# Patient Record
Sex: Male | Born: 1965 | Race: Black or African American | Hispanic: No | Marital: Single | Smoking: Current every day smoker
Health system: Southern US, Community
[De-identification: ages and names within clinical notes are randomized; demographics above are authoritative.]

## PROBLEM LIST (undated history)

## (undated) DIAGNOSIS — I10 Essential (primary) hypertension: Secondary | ICD-10-CM

## (undated) HISTORY — PX: ABDOMINAL SURGERY: SHX537

## (undated) HISTORY — PX: CHOLECYSTECTOMY: SHX55

---

## 2006-11-21 ENCOUNTER — Emergency Department: Payer: Self-pay | Admitting: Unknown Physician Specialty

## 2008-12-31 ENCOUNTER — Emergency Department: Payer: Self-pay | Admitting: Emergency Medicine

## 2010-09-10 ENCOUNTER — Emergency Department: Payer: Self-pay | Admitting: Emergency Medicine

## 2013-08-17 LAB — COMPREHENSIVE METABOLIC PANEL
Alkaline Phosphatase: 112 U/L (ref 50–136)
Anion Gap: 7 (ref 7–16)
Calcium, Total: 9.7 mg/dL (ref 8.5–10.1)
Co2: 29 mmol/L (ref 21–32)
EGFR (Non-African Amer.): >60
Osmolality: 275 (ref 275–301)
Sodium: 135 mmol/L — ABNORMAL LOW (ref 136–145)
Total Protein: 9.2 g/dL — ABNORMAL HIGH (ref 6.4–8.2)

## 2013-08-17 LAB — CBC
HGB: 17.9 g/dL (ref 13.0–18.0)
MCH: 31.6 pg (ref 26.0–34.0)
MCHC: 35.2 g/dL (ref 32.0–36.0)

## 2013-08-18 ENCOUNTER — Inpatient Hospital Stay: Payer: Self-pay | Admitting: Internal Medicine

## 2013-08-18 LAB — CBC WITH DIFFERENTIAL/PLATELET
Eosinophil #: 0 10*3/uL (ref 0.0–0.7)
Eosinophil %: 0.1 %
HGB: 16.6 g/dL (ref 13.0–18.0)
Lymphocyte #: 1.9 10*3/uL (ref 1.0–3.6)
MCHC: 35.6 g/dL (ref 32.0–36.0)
MCV: 89 fL (ref 80–100)
Monocyte #: 1.8 x10 3/mm — ABNORMAL HIGH (ref 0.2–1.0)
Monocyte %: 12.6 %
Neutrophil %: 73.5 %
RBC: 5.24 10*6/uL (ref 4.40–5.90)
RDW: 12.6 % (ref 11.5–14.5)

## 2013-08-18 LAB — URINALYSIS, COMPLETE
Bacteria: NONE SEEN
Glucose,UR: NEGATIVE mg/dL (ref 0–75)
Leukocyte Esterase: NEGATIVE
Nitrite: NEGATIVE
Ph: 6 (ref 4.5–8.0)
RBC,UR: 46 /HPF (ref 0–5)
Specific Gravity: 1.027 (ref 1.003–1.030)
WBC UR: 4 /HPF (ref 0–5)

## 2013-08-18 LAB — CK-MB: CK-MB: 0.9 ng/mL (ref 0.5–3.6)

## 2013-08-18 LAB — TROPONIN I
Troponin-I: 0.06 ng/mL — ABNORMAL HIGH
Troponin-I: 0.05 ng/mL

## 2013-08-18 LAB — APTT: Activated PTT: 28.5 secs (ref 23.6–35.9)

## 2013-08-18 LAB — MAGNESIUM: Magnesium: 1.4 mg/dL — ABNORMAL LOW

## 2013-08-18 LAB — LIPID PANEL
Cholesterol: 131 mg/dL (ref 0–200)
HDL Cholesterol: 32 mg/dL — ABNORMAL LOW (ref 40–60)
Ldl Cholesterol, Calc: 84 mg/dL (ref 0–100)

## 2013-08-19 LAB — CBC WITH DIFFERENTIAL/PLATELET
Basophil %: 0.4 %
Eosinophil #: 0 10*3/uL (ref 0.0–0.7)
HCT: 47.2 % (ref 40.0–52.0)
HGB: 16.5 g/dL (ref 13.0–18.0)
Lymphocyte #: 2.4 10*3/uL (ref 1.0–3.6)
MCH: 31.6 pg (ref 26.0–34.0)
MCHC: 34.9 g/dL (ref 32.0–36.0)
MCV: 91 fL (ref 80–100)
Monocyte #: 1.7 x10 3/mm — ABNORMAL HIGH (ref 0.2–1.0)
Monocyte %: 12.8 %
Neutrophil #: 9.1 10*3/uL — ABNORMAL HIGH (ref 1.4–6.5)
Platelet: 251 10*3/uL (ref 150–440)
RBC: 5.2 10*6/uL (ref 4.40–5.90)
RDW: 12.4 % (ref 11.5–14.5)
WBC: 13.2 10*3/uL — ABNORMAL HIGH (ref 3.8–10.6)

## 2013-08-19 LAB — BASIC METABOLIC PANEL
Chloride: 104 mmol/L (ref 98–107)
Creatinine: 1.09 mg/dL (ref 0.60–1.30)
Osmolality: 274 (ref 275–301)
Potassium: 3.8 mmol/L (ref 3.5–5.1)
Sodium: 136 mmol/L (ref 136–145)

## 2013-08-19 LAB — APTT
Activated PTT: 66.6 secs — ABNORMAL HIGH (ref 23.6–35.9)
Activated PTT: 49.2 secs — ABNORMAL HIGH (ref 23.6–35.9)

## 2013-08-19 LAB — CK-MB: CK-MB: 1 ng/mL (ref 0.5–3.6)

## 2013-08-23 LAB — CULTURE, BLOOD (SINGLE)

## 2013-09-11 ENCOUNTER — Inpatient Hospital Stay: Payer: Self-pay | Admitting: Internal Medicine

## 2013-09-11 LAB — COMPREHENSIVE METABOLIC PANEL
Alkaline Phosphatase: 85 U/L
Calcium, Total: 9.7 mg/dL (ref 8.5–10.1)
Chloride: 104 mmol/L (ref 98–107)
Co2: 27 mmol/L (ref 21–32)
EGFR (African American): >60
Glucose: 118 mg/dL — ABNORMAL HIGH (ref 65–99)
Osmolality: 278 (ref 275–301)
Potassium: 3.5 mmol/L (ref 3.5–5.1)
SGOT(AST): 44 U/L — ABNORMAL HIGH (ref 15–37)
SGPT (ALT): 57 U/L (ref 12–78)
Sodium: 137 mmol/L (ref 136–145)
Total Protein: 8.9 g/dL — ABNORMAL HIGH (ref 6.4–8.2)

## 2013-09-11 LAB — URINALYSIS, COMPLETE
Bacteria: NONE SEEN
Bilirubin,UR: NEGATIVE
Glucose,UR: NEGATIVE mg/dL (ref 0–75)
Leukocyte Esterase: NEGATIVE
Nitrite: NEGATIVE
Ph: 5 (ref 4.5–8.0)
Protein: 30
RBC,UR: 6 /HPF (ref 0–5)
Specific Gravity: 1.025 (ref 1.003–1.030)
Squamous Epithelial: NONE SEEN
WBC UR: 3 /HPF (ref 0–5)

## 2013-09-11 LAB — CBC
HCT: 48.5 % (ref 40.0–52.0)
MCH: 31.6 pg (ref 26.0–34.0)
MCHC: 35 g/dL (ref 32.0–36.0)
MCV: 90 fL (ref 80–100)
RDW: 12.4 % (ref 11.5–14.5)
WBC: 10.9 10*3/uL — ABNORMAL HIGH (ref 3.8–10.6)

## 2013-09-11 LAB — LIPASE, BLOOD: Lipase: 1018 U/L — ABNORMAL HIGH (ref 73–393)

## 2013-09-11 LAB — TROPONIN I: Troponin-I: <0.02 ng/mL

## 2013-09-12 LAB — LIPID PANEL
Cholesterol: 116 mg/dL (ref 0–200)
HDL Cholesterol: 26 mg/dL — ABNORMAL LOW (ref 40–60)
Ldl Cholesterol, Calc: 73 mg/dL (ref 0–100)
Triglycerides: 87 mg/dL (ref 0–200)

## 2013-09-12 LAB — CBC WITH DIFFERENTIAL/PLATELET
Eosinophil #: 0.2 10*3/uL (ref 0.0–0.7)
Eosinophil %: 2.6 %
HCT: 41.1 % (ref 40.0–52.0)
HGB: 14.3 g/dL (ref 13.0–18.0)
Lymphocyte %: 30.7 %
MCH: 31.8 pg (ref 26.0–34.0)
MCHC: 34.8 g/dL (ref 32.0–36.0)
MCV: 91 fL (ref 80–100)
Monocyte #: 1.2 x10 3/mm — ABNORMAL HIGH (ref 0.2–1.0)
Neutrophil #: 4.1 10*3/uL (ref 1.4–6.5)
Neutrophil %: 51.5 %
RBC: 4.5 10*6/uL (ref 4.40–5.90)
RDW: 12.3 % (ref 11.5–14.5)

## 2013-09-12 LAB — BASIC METABOLIC PANEL
BUN: 18 mg/dL (ref 7–18)
Calcium, Total: 9 mg/dL (ref 8.5–10.1)
Creatinine: 1.11 mg/dL (ref 0.60–1.30)
Potassium: 4.1 mmol/L (ref 3.5–5.1)

## 2013-09-14 LAB — COMPREHENSIVE METABOLIC PANEL
Albumin: 3.6 g/dL (ref 3.4–5.0)
Calcium, Total: 9.2 mg/dL (ref 8.5–10.1)
Co2: 26 mmol/L (ref 21–32)
Glucose: 88 mg/dL (ref 65–99)
Osmolality: 272 (ref 275–301)
SGPT (ALT): 74 U/L (ref 12–78)
Sodium: 137 mmol/L (ref 136–145)
Total Protein: 7.8 g/dL (ref 6.4–8.2)

## 2013-09-14 LAB — CBC WITH DIFFERENTIAL/PLATELET
Basophil %: 1 %
HCT: 46.3 % (ref 40.0–52.0)
HGB: 16.3 g/dL (ref 13.0–18.0)
MCHC: 35.2 g/dL (ref 32.0–36.0)
MCV: 90 fL (ref 80–100)
Monocyte #: 1.3 x10 3/mm — ABNORMAL HIGH (ref 0.2–1.0)
Neutrophil #: 4.1 10*3/uL (ref 1.4–6.5)
RBC: 5.16 10*6/uL (ref 4.40–5.90)

## 2013-09-14 LAB — LIPASE, BLOOD: Lipase: 196 U/L (ref 73–393)

## 2013-11-02 ENCOUNTER — Ambulatory Visit: Payer: Self-pay | Admitting: Gastroenterology

## 2013-11-04 ENCOUNTER — Inpatient Hospital Stay: Payer: Self-pay | Admitting: Internal Medicine

## 2013-11-04 LAB — URINALYSIS, COMPLETE
BACTERIA: NONE SEEN
BILIRUBIN, UR: NEGATIVE
BLOOD: NEGATIVE
Glucose,UR: NEGATIVE mg/dL (ref 0–75)
KETONE: NEGATIVE
LEUKOCYTE ESTERASE: NEGATIVE
NITRITE: NEGATIVE
Ph: 8 (ref 4.5–8.0)
Protein: 30
RBC,UR: 7 /HPF (ref 0–5)
SQUAMOUS EPITHELIAL: NONE SEEN
Specific Gravity: 1.021 (ref 1.003–1.030)
WBC UR: <1 /HPF (ref 0–5)

## 2013-11-04 LAB — CBC
HCT: 50.6 % (ref 40.0–52.0)
HGB: 17.7 g/dL (ref 13.0–18.0)
MCH: 31.8 pg (ref 26.0–34.0)
MCHC: 34.9 g/dL (ref 32.0–36.0)
MCV: 91 fL (ref 80–100)
PLATELETS: 323 10*3/uL (ref 150–440)
RBC: 5.55 10*6/uL (ref 4.40–5.90)
RDW: 12.9 % (ref 11.5–14.5)
WBC: 9.2 10*3/uL (ref 3.8–10.6)

## 2013-11-04 LAB — COMPREHENSIVE METABOLIC PANEL
ALBUMIN: 4.5 g/dL (ref 3.4–5.0)
Alkaline Phosphatase: 87 U/L
Anion Gap: 2 — ABNORMAL LOW (ref 7–16)
BUN: 15 mg/dL (ref 7–18)
Bilirubin,Total: 1.4 mg/dL — ABNORMAL HIGH (ref 0.2–1.0)
CALCIUM: 9.8 mg/dL (ref 8.5–10.1)
Chloride: 100 mmol/L (ref 98–107)
Co2: 33 mmol/L — ABNORMAL HIGH (ref 21–32)
Creatinine: 1.14 mg/dL (ref 0.60–1.30)
EGFR (African American): >60
GLUCOSE: 120 mg/dL — AB (ref 65–99)
OSMOLALITY: 272 (ref 275–301)
Potassium: 3.6 mmol/L (ref 3.5–5.1)
SGOT(AST): 38 U/L — ABNORMAL HIGH (ref 15–37)
SGPT (ALT): 57 U/L (ref 12–78)
Sodium: 135 mmol/L — ABNORMAL LOW (ref 136–145)
Total Protein: 9.3 g/dL — ABNORMAL HIGH (ref 6.4–8.2)

## 2013-11-04 LAB — AMYLASE: Amylase: 153 U/L — ABNORMAL HIGH (ref 25–115)

## 2013-11-04 LAB — LIPASE, BLOOD: LIPASE: 678 U/L — AB (ref 73–393)

## 2013-11-06 LAB — COMPREHENSIVE METABOLIC PANEL
AST: 36 U/L (ref 15–37)
Albumin: 3.3 g/dL — ABNORMAL LOW (ref 3.4–5.0)
Alkaline Phosphatase: 62 U/L
Anion Gap: 7 (ref 7–16)
BUN: 11 mg/dL (ref 7–18)
Bilirubin,Total: 1.6 mg/dL — ABNORMAL HIGH (ref 0.2–1.0)
Calcium, Total: 9 mg/dL (ref 8.5–10.1)
Chloride: 102 mmol/L (ref 98–107)
Co2: 24 mmol/L (ref 21–32)
Creatinine: 1 mg/dL (ref 0.60–1.30)
EGFR (African American): >60
EGFR (Non-African Amer.): >60
Glucose: 87 mg/dL (ref 65–99)
Osmolality: 265 (ref 275–301)
Potassium: 3.4 mmol/L — ABNORMAL LOW (ref 3.5–5.1)
SGPT (ALT): 37 U/L (ref 12–78)
Sodium: 133 mmol/L — ABNORMAL LOW (ref 136–145)
Total Protein: 6.8 g/dL (ref 6.4–8.2)

## 2013-11-06 LAB — CBC WITH DIFFERENTIAL/PLATELET
Basophil #: 0 10*3/uL (ref 0.0–0.1)
Basophil %: 0.5 %
EOS PCT: 5.6 %
Eosinophil #: 0.4 10*3/uL (ref 0.0–0.7)
HCT: 41.8 % (ref 40.0–52.0)
HGB: 14.4 g/dL (ref 13.0–18.0)
LYMPHS ABS: 1.8 10*3/uL (ref 1.0–3.6)
LYMPHS PCT: 24 %
MCH: 32.1 pg (ref 26.0–34.0)
MCHC: 34.5 g/dL (ref 32.0–36.0)
MCV: 93 fL (ref 80–100)
MONO ABS: 1.1 x10 3/mm — AB (ref 0.2–1.0)
Monocyte %: 14.3 %
NEUTROS ABS: 4.1 10*3/uL (ref 1.4–6.5)
Neutrophil %: 55.6 %
Platelet: 257 10*3/uL (ref 150–440)
RBC: 4.5 10*6/uL (ref 4.40–5.90)
RDW: 12.6 % (ref 11.5–14.5)
WBC: 7.4 10*3/uL (ref 3.8–10.6)

## 2013-11-06 LAB — LIPASE, BLOOD: Lipase: 842 U/L — ABNORMAL HIGH (ref 73–393)

## 2013-11-07 LAB — LIPASE, BLOOD: Lipase: 3026 U/L — ABNORMAL HIGH (ref 73–393)

## 2013-11-08 LAB — LIPASE, BLOOD: Lipase: 241 U/L (ref 73–393)

## 2013-12-07 LAB — COMPREHENSIVE METABOLIC PANEL
ALT: 51 U/L (ref 12–78)
Albumin: 4.2 g/dL (ref 3.4–5.0)
Alkaline Phosphatase: 78 U/L
Anion Gap: 7 (ref 7–16)
BILIRUBIN TOTAL: 1.8 mg/dL — AB (ref 0.2–1.0)
BUN: 20 mg/dL — ABNORMAL HIGH (ref 7–18)
Calcium, Total: 9.7 mg/dL (ref 8.5–10.1)
Chloride: 97 mmol/L — ABNORMAL LOW (ref 98–107)
Co2: 31 mmol/L (ref 21–32)
Creatinine: 1.11 mg/dL (ref 0.60–1.30)
EGFR (African American): >60
Glucose: 138 mg/dL — ABNORMAL HIGH (ref 65–99)
Osmolality: 275 (ref 275–301)
Potassium: 3.2 mmol/L — ABNORMAL LOW (ref 3.5–5.1)
SGOT(AST): 51 U/L — ABNORMAL HIGH (ref 15–37)
SODIUM: 135 mmol/L — AB (ref 136–145)
Total Protein: 8.8 g/dL — ABNORMAL HIGH (ref 6.4–8.2)

## 2013-12-07 LAB — CBC WITH DIFFERENTIAL/PLATELET
Basophil #: 0.1 10*3/uL (ref 0.0–0.1)
Basophil %: 0.8 %
EOS PCT: 0.8 %
Eosinophil #: 0.1 10*3/uL (ref 0.0–0.7)
HCT: 50.3 % (ref 40.0–52.0)
HGB: 17.7 g/dL (ref 13.0–18.0)
Lymphocyte #: 2.7 10*3/uL (ref 1.0–3.6)
Lymphocyte %: 28 %
MCH: 32.3 pg (ref 26.0–34.0)
MCHC: 35.2 g/dL (ref 32.0–36.0)
MCV: 92 fL (ref 80–100)
MONOS PCT: 12.2 %
Monocyte #: 1.2 x10 3/mm — ABNORMAL HIGH (ref 0.2–1.0)
NEUTROS PCT: 58.2 %
Neutrophil #: 5.7 10*3/uL (ref 1.4–6.5)
PLATELETS: 346 10*3/uL (ref 150–440)
RBC: 5.49 10*6/uL (ref 4.40–5.90)
RDW: 12.9 % (ref 11.5–14.5)
WBC: 9.8 10*3/uL (ref 3.8–10.6)

## 2013-12-07 LAB — LIPASE, BLOOD: LIPASE: 422 U/L — AB (ref 73–393)

## 2013-12-08 ENCOUNTER — Inpatient Hospital Stay: Payer: Self-pay | Admitting: Surgery

## 2013-12-08 LAB — URINALYSIS, COMPLETE
Bacteria: NONE SEEN
Bilirubin,UR: NEGATIVE
GLUCOSE, UR: NEGATIVE mg/dL (ref 0–75)
Leukocyte Esterase: NEGATIVE
NITRITE: NEGATIVE
Ph: 5 (ref 4.5–8.0)
Protein: 100
RBC,UR: 11 /HPF (ref 0–5)
SPECIFIC GRAVITY: 1.028 (ref 1.003–1.030)

## 2013-12-08 LAB — AMYLASE: AMYLASE: 110 U/L (ref 25–115)

## 2013-12-08 LAB — PROTIME-INR
INR: 1.2
Prothrombin Time: 14.8 secs — ABNORMAL HIGH (ref 11.5–14.7)

## 2013-12-08 LAB — APTT: Activated PTT: 28.8 secs (ref 23.6–35.9)

## 2013-12-09 LAB — CBC WITH DIFFERENTIAL/PLATELET
BASOS PCT: 0.7 %
Basophil #: 0 10*3/uL (ref 0.0–0.1)
Eosinophil #: 0.3 10*3/uL (ref 0.0–0.7)
Eosinophil %: 4.5 %
HCT: 42.2 % (ref 40.0–52.0)
HGB: 14.5 g/dL (ref 13.0–18.0)
LYMPHS PCT: 40.1 %
Lymphocyte #: 2.8 10*3/uL (ref 1.0–3.6)
MCH: 31.7 pg (ref 26.0–34.0)
MCHC: 34.3 g/dL (ref 32.0–36.0)
MCV: 92 fL (ref 80–100)
MONOS PCT: 10.5 %
Monocyte #: 0.7 x10 3/mm (ref 0.2–1.0)
Neutrophil #: 3.1 10*3/uL (ref 1.4–6.5)
Neutrophil %: 44.2 %
Platelet: 263 10*3/uL (ref 150–440)
RBC: 4.57 10*6/uL (ref 4.40–5.90)
RDW: 13 % (ref 11.5–14.5)
WBC: 7 10*3/uL (ref 3.8–10.6)

## 2013-12-09 LAB — COMPREHENSIVE METABOLIC PANEL
ALK PHOS: 56 U/L
ANION GAP: 2 — AB (ref 7–16)
Albumin: 3.2 g/dL — ABNORMAL LOW (ref 3.4–5.0)
BUN: 14 mg/dL (ref 7–18)
Bilirubin,Total: 0.9 mg/dL (ref 0.2–1.0)
CHLORIDE: 105 mmol/L (ref 98–107)
CREATININE: 1.14 mg/dL (ref 0.60–1.30)
Calcium, Total: 8.5 mg/dL (ref 8.5–10.1)
Co2: 32 mmol/L (ref 21–32)
EGFR (African American): >60
EGFR (Non-African Amer.): >60
Glucose: 108 mg/dL — ABNORMAL HIGH (ref 65–99)
Osmolality: 279 (ref 275–301)
POTASSIUM: 3.8 mmol/L (ref 3.5–5.1)
SGOT(AST): 28 U/L (ref 15–37)
SGPT (ALT): 32 U/L (ref 12–78)
SODIUM: 139 mmol/L (ref 136–145)
TOTAL PROTEIN: 6.5 g/dL (ref 6.4–8.2)

## 2013-12-09 LAB — MAGNESIUM: MAGNESIUM: 1.9 mg/dL

## 2013-12-09 LAB — AMYLASE: Amylase: 70 U/L (ref 25–115)

## 2013-12-09 LAB — LIPASE, BLOOD: Lipase: 146 U/L (ref 73–393)

## 2013-12-10 LAB — BASIC METABOLIC PANEL
Anion Gap: 4 — ABNORMAL LOW (ref 7–16)
BUN: 10 mg/dL (ref 7–18)
CREATININE: 1.04 mg/dL (ref 0.60–1.30)
Calcium, Total: 8.4 mg/dL — ABNORMAL LOW (ref 8.5–10.1)
Chloride: 108 mmol/L — ABNORMAL HIGH (ref 98–107)
Co2: 28 mmol/L (ref 21–32)
EGFR (Non-African Amer.): >60
Glucose: 90 mg/dL (ref 65–99)
Osmolality: 278 (ref 275–301)
POTASSIUM: 3.4 mmol/L — AB (ref 3.5–5.1)
SODIUM: 140 mmol/L (ref 136–145)

## 2013-12-10 LAB — CBC WITH DIFFERENTIAL/PLATELET
BASOS PCT: 0.8 %
Basophil #: 0.1 10*3/uL (ref 0.0–0.1)
Eosinophil #: 0.4 10*3/uL (ref 0.0–0.7)
Eosinophil %: 7.2 %
HCT: 40.5 % (ref 40.0–52.0)
HGB: 14.1 g/dL (ref 13.0–18.0)
LYMPHS ABS: 2.8 10*3/uL (ref 1.0–3.6)
LYMPHS PCT: 45.7 %
MCH: 32.1 pg (ref 26.0–34.0)
MCHC: 34.8 g/dL (ref 32.0–36.0)
MCV: 92 fL (ref 80–100)
MONO ABS: 0.7 x10 3/mm (ref 0.2–1.0)
Monocyte %: 11.9 %
Neutrophil #: 2.1 10*3/uL (ref 1.4–6.5)
Neutrophil %: 34.4 %
PLATELETS: 252 10*3/uL (ref 150–440)
RBC: 4.38 10*6/uL — ABNORMAL LOW (ref 4.40–5.90)
RDW: 12.9 % (ref 11.5–14.5)
WBC: 6.2 10*3/uL (ref 3.8–10.6)

## 2013-12-10 LAB — LIPASE, BLOOD: LIPASE: 165 U/L (ref 73–393)

## 2013-12-10 LAB — AMYLASE: AMYLASE: 64 U/L (ref 25–115)

## 2013-12-21 ENCOUNTER — Ambulatory Visit: Payer: Self-pay | Admitting: Surgery

## 2013-12-21 DIAGNOSIS — I10 Essential (primary) hypertension: Secondary | ICD-10-CM

## 2013-12-21 LAB — CBC WITH DIFFERENTIAL/PLATELET
Basophil #: 0.1 10*3/uL (ref 0.0–0.1)
Basophil %: 0.9 %
EOS ABS: 0.5 10*3/uL (ref 0.0–0.7)
EOS PCT: 8.2 %
HCT: 43.9 % (ref 40.0–52.0)
HGB: 15.3 g/dL (ref 13.0–18.0)
LYMPHS ABS: 1.9 10*3/uL (ref 1.0–3.6)
Lymphocyte %: 33.4 %
MCH: 32.4 pg (ref 26.0–34.0)
MCHC: 34.8 g/dL (ref 32.0–36.0)
MCV: 93 fL (ref 80–100)
Monocyte #: 0.8 x10 3/mm (ref 0.2–1.0)
Monocyte %: 13.3 %
NEUTROS PCT: 44.2 %
Neutrophil #: 2.5 10*3/uL (ref 1.4–6.5)
Platelet: 306 10*3/uL (ref 150–440)
RBC: 4.73 10*6/uL (ref 4.40–5.90)
RDW: 13.5 % (ref 11.5–14.5)
WBC: 5.7 10*3/uL (ref 3.8–10.6)

## 2013-12-21 LAB — BASIC METABOLIC PANEL
Anion Gap: 5 — ABNORMAL LOW (ref 7–16)
BUN: 13 mg/dL (ref 7–18)
CREATININE: 0.89 mg/dL (ref 0.60–1.30)
Calcium, Total: 8.6 mg/dL (ref 8.5–10.1)
Chloride: 108 mmol/L — ABNORMAL HIGH (ref 98–107)
Co2: 25 mmol/L (ref 21–32)
EGFR (African American): >60
GLUCOSE: 107 mg/dL — AB (ref 65–99)
OSMOLALITY: 276 (ref 275–301)
Potassium: 3.8 mmol/L (ref 3.5–5.1)
Sodium: 138 mmol/L (ref 136–145)

## 2013-12-21 LAB — LIPASE, BLOOD: Lipase: 325 U/L (ref 73–393)

## 2014-01-11 ENCOUNTER — Emergency Department: Payer: Self-pay | Admitting: Emergency Medicine

## 2014-01-11 LAB — COMPREHENSIVE METABOLIC PANEL
ANION GAP: 6 — AB (ref 7–16)
Albumin: 4.2 g/dL (ref 3.4–5.0)
Alkaline Phosphatase: 74 U/L
BILIRUBIN TOTAL: 0.9 mg/dL (ref 0.2–1.0)
BUN: 16 mg/dL (ref 7–18)
Calcium, Total: 10.1 mg/dL (ref 8.5–10.1)
Chloride: 101 mmol/L (ref 98–107)
Co2: 29 mmol/L (ref 21–32)
Creatinine: 1.15 mg/dL (ref 0.60–1.30)
EGFR (Non-African Amer.): >60
Glucose: 126 mg/dL — ABNORMAL HIGH (ref 65–99)
Osmolality: 275 (ref 275–301)
Potassium: 3.8 mmol/L (ref 3.5–5.1)
SGOT(AST): 25 U/L (ref 15–37)
SGPT (ALT): 39 U/L (ref 12–78)
Sodium: 136 mmol/L (ref 136–145)
TOTAL PROTEIN: 8.9 g/dL — AB (ref 6.4–8.2)

## 2014-01-11 LAB — CBC
HCT: 50.3 % (ref 40.0–52.0)
HGB: 17.3 g/dL (ref 13.0–18.0)
MCH: 31.7 pg (ref 26.0–34.0)
MCHC: 34.5 g/dL (ref 32.0–36.0)
MCV: 92 fL (ref 80–100)
Platelet: 297 10*3/uL (ref 150–440)
RBC: 5.47 10*6/uL (ref 4.40–5.90)
RDW: 13.1 % (ref 11.5–14.5)
WBC: 8 10*3/uL (ref 3.8–10.6)

## 2014-01-11 LAB — LIPASE, BLOOD: Lipase: 134 U/L (ref 73–393)

## 2014-01-12 LAB — URINALYSIS, COMPLETE
Bacteria: NONE SEEN
Bilirubin,UR: NEGATIVE
Glucose,UR: NEGATIVE mg/dL (ref 0–75)
Leukocyte Esterase: NEGATIVE
NITRITE: NEGATIVE
PH: 6 (ref 4.5–8.0)
PROTEIN: NEGATIVE
SPECIFIC GRAVITY: 1.048 (ref 1.003–1.030)
SQUAMOUS EPITHELIAL: NONE SEEN
WBC UR: <1 /HPF (ref 0–5)

## 2014-01-17 ENCOUNTER — Emergency Department: Payer: Self-pay

## 2014-01-17 LAB — COMPREHENSIVE METABOLIC PANEL
ALK PHOS: 77 U/L
AST: 29 U/L (ref 15–37)
Albumin: 4.2 g/dL (ref 3.4–5.0)
Anion Gap: 6 — ABNORMAL LOW (ref 7–16)
BILIRUBIN TOTAL: 1.5 mg/dL — AB (ref 0.2–1.0)
BUN: 17 mg/dL (ref 7–18)
CREATININE: 1.02 mg/dL (ref 0.60–1.30)
Calcium, Total: 9.5 mg/dL (ref 8.5–10.1)
Chloride: 100 mmol/L (ref 98–107)
Co2: 30 mmol/L (ref 21–32)
EGFR (Non-African Amer.): >60
Glucose: 117 mg/dL — ABNORMAL HIGH (ref 65–99)
OSMOLALITY: 275 (ref 275–301)
Potassium: 3.3 mmol/L — ABNORMAL LOW (ref 3.5–5.1)
SGPT (ALT): 37 U/L (ref 12–78)
SODIUM: 136 mmol/L (ref 136–145)
Total Protein: 8.7 g/dL — ABNORMAL HIGH (ref 6.4–8.2)

## 2014-01-17 LAB — CBC WITH DIFFERENTIAL/PLATELET
BASOS ABS: 0.1 10*3/uL (ref 0.0–0.1)
Basophil %: 0.8 %
EOS PCT: 0.4 %
Eosinophil #: 0 10*3/uL (ref 0.0–0.7)
HCT: 51.8 % (ref 40.0–52.0)
HGB: 17.8 g/dL (ref 13.0–18.0)
LYMPHS ABS: 2.4 10*3/uL (ref 1.0–3.6)
Lymphocyte %: 27.1 %
MCH: 31.4 pg (ref 26.0–34.0)
MCHC: 34.4 g/dL (ref 32.0–36.0)
MCV: 91 fL (ref 80–100)
MONO ABS: 0.9 x10 3/mm (ref 0.2–1.0)
Monocyte %: 10.8 %
NEUTROS PCT: 60.9 %
Neutrophil #: 5.3 10*3/uL (ref 1.4–6.5)
Platelet: 295 10*3/uL (ref 150–440)
RBC: 5.68 10*6/uL (ref 4.40–5.90)
RDW: 12.7 % (ref 11.5–14.5)
WBC: 8.8 10*3/uL (ref 3.8–10.6)

## 2014-01-17 LAB — LIPASE, BLOOD: LIPASE: 102 U/L (ref 73–393)

## 2014-01-18 ENCOUNTER — Ambulatory Visit: Payer: Self-pay | Admitting: Gastroenterology

## 2014-01-19 ENCOUNTER — Emergency Department: Payer: Self-pay | Admitting: Emergency Medicine

## 2014-01-19 LAB — COMPREHENSIVE METABOLIC PANEL
Albumin: 4.2 g/dL (ref 3.4–5.0)
Alkaline Phosphatase: 73 U/L
Anion Gap: 7 (ref 7–16)
BUN: 16 mg/dL (ref 7–18)
Bilirubin,Total: 2 mg/dL — ABNORMAL HIGH (ref 0.2–1.0)
CALCIUM: 9.5 mg/dL (ref 8.5–10.1)
Chloride: 100 mmol/L (ref 98–107)
Co2: 29 mmol/L (ref 21–32)
Creatinine: 0.79 mg/dL (ref 0.60–1.30)
EGFR (African American): >60
EGFR (Non-African Amer.): >60
Glucose: 122 mg/dL — ABNORMAL HIGH (ref 65–99)
OSMOLALITY: 274 (ref 275–301)
Potassium: 4.5 mmol/L (ref 3.5–5.1)
SGOT(AST): 48 U/L — ABNORMAL HIGH (ref 15–37)
SGPT (ALT): 41 U/L (ref 12–78)
SODIUM: 136 mmol/L (ref 136–145)
Total Protein: 8.5 g/dL — ABNORMAL HIGH (ref 6.4–8.2)

## 2014-01-19 LAB — URINALYSIS, COMPLETE
BACTERIA: NONE SEEN
BLOOD: NEGATIVE
Bilirubin,UR: NEGATIVE
Glucose,UR: NEGATIVE mg/dL (ref 0–75)
Leukocyte Esterase: NEGATIVE
Nitrite: NEGATIVE
Ph: 8 (ref 4.5–8.0)
Specific Gravity: 1.023 (ref 1.003–1.030)
Squamous Epithelial: NONE SEEN
WBC UR: 1 /HPF (ref 0–5)

## 2014-01-19 LAB — CBC WITH DIFFERENTIAL/PLATELET
Basophil #: 0.1 10*3/uL (ref 0.0–0.1)
Basophil %: 0.8 %
Eosinophil #: 0.1 10*3/uL (ref 0.0–0.7)
Eosinophil %: 1 %
HCT: 50.7 % (ref 40.0–52.0)
HGB: 17.4 g/dL (ref 13.0–18.0)
Lymphocyte #: 2.3 10*3/uL (ref 1.0–3.6)
Lymphocyte %: 26.5 %
MCH: 31.3 pg (ref 26.0–34.0)
MCHC: 34.3 g/dL (ref 32.0–36.0)
MCV: 91 fL (ref 80–100)
MONO ABS: 1.1 x10 3/mm — AB (ref 0.2–1.0)
Monocyte %: 13.1 %
NEUTROS ABS: 5 10*3/uL (ref 1.4–6.5)
Neutrophil %: 58.6 %
PLATELETS: 289 10*3/uL (ref 150–440)
RBC: 5.55 10*6/uL (ref 4.40–5.90)
RDW: 12.7 % (ref 11.5–14.5)
WBC: 8.6 10*3/uL (ref 3.8–10.6)

## 2014-01-19 LAB — LIPASE, BLOOD: LIPASE: 845 U/L — AB (ref 73–393)

## 2014-01-25 ENCOUNTER — Emergency Department: Payer: Self-pay | Admitting: Emergency Medicine

## 2014-01-25 LAB — COMPREHENSIVE METABOLIC PANEL
ALBUMIN: 4 g/dL (ref 3.4–5.0)
ALK PHOS: 68 U/L
ANION GAP: 6 — AB (ref 7–16)
BILIRUBIN TOTAL: 0.7 mg/dL (ref 0.2–1.0)
BUN: 11 mg/dL (ref 7–18)
CO2: 30 mmol/L (ref 21–32)
Calcium, Total: 9.3 mg/dL (ref 8.5–10.1)
Chloride: 102 mmol/L (ref 98–107)
Creatinine: 0.88 mg/dL (ref 0.60–1.30)
EGFR (Non-African Amer.): >60
GLUCOSE: 125 mg/dL — AB (ref 65–99)
Osmolality: 277 (ref 275–301)
Potassium: 3.2 mmol/L — ABNORMAL LOW (ref 3.5–5.1)
SGOT(AST): 31 U/L (ref 15–37)
SGPT (ALT): 43 U/L (ref 12–78)
Sodium: 138 mmol/L (ref 136–145)
Total Protein: 8.2 g/dL (ref 6.4–8.2)

## 2014-01-25 LAB — CBC WITH DIFFERENTIAL/PLATELET
Basophil #: 0.1 10*3/uL (ref 0.0–0.1)
Basophil %: 1 %
Eosinophil #: 0.1 10*3/uL (ref 0.0–0.7)
Eosinophil %: 0.7 %
HCT: 50.8 % (ref 40.0–52.0)
HGB: 17.8 g/dL (ref 13.0–18.0)
Lymphocyte #: 1.7 10*3/uL (ref 1.0–3.6)
Lymphocyte %: 19.9 %
MCH: 31.7 pg (ref 26.0–34.0)
MCHC: 35 g/dL (ref 32.0–36.0)
MCV: 91 fL (ref 80–100)
Monocyte #: 0.8 x10 3/mm (ref 0.2–1.0)
Monocyte %: 10.1 %
NEUTROS PCT: 68.3 %
Neutrophil #: 5.7 10*3/uL (ref 1.4–6.5)
Platelet: 306 10*3/uL (ref 150–440)
RBC: 5.6 10*6/uL (ref 4.40–5.90)
RDW: 12.6 % (ref 11.5–14.5)
WBC: 8.4 10*3/uL (ref 3.8–10.6)

## 2014-01-25 LAB — LIPASE, BLOOD: LIPASE: 401 U/L — AB (ref 73–393)

## 2014-01-31 ENCOUNTER — Ambulatory Visit: Payer: Self-pay | Admitting: Surgery

## 2014-01-31 LAB — CBC WITH DIFFERENTIAL/PLATELET
BASOS ABS: 0 10*3/uL (ref 0.0–0.1)
Basophil %: 0.4 %
EOS ABS: 0.3 10*3/uL (ref 0.0–0.7)
Eosinophil %: 4.4 %
HCT: 47.9 % (ref 40.0–52.0)
HGB: 16.7 g/dL (ref 13.0–18.0)
LYMPHS ABS: 2 10*3/uL (ref 1.0–3.6)
Lymphocyte %: 27.5 %
MCH: 31.4 pg (ref 26.0–34.0)
MCHC: 34.8 g/dL (ref 32.0–36.0)
MCV: 90 fL (ref 80–100)
Monocyte #: 0.7 x10 3/mm (ref 0.2–1.0)
Monocyte %: 10.2 %
NEUTROS ABS: 4.1 10*3/uL (ref 1.4–6.5)
Neutrophil %: 57.5 %
Platelet: 304 10*3/uL (ref 150–440)
RBC: 5.31 10*6/uL (ref 4.40–5.90)
RDW: 12.5 % (ref 11.5–14.5)
WBC: 7.1 10*3/uL (ref 3.8–10.6)

## 2014-01-31 LAB — BASIC METABOLIC PANEL
Anion Gap: 7 (ref 7–16)
BUN: 10 mg/dL (ref 7–18)
Calcium, Total: 9.2 mg/dL (ref 8.5–10.1)
Chloride: 101 mmol/L (ref 98–107)
Co2: 32 mmol/L (ref 21–32)
Creatinine: 1.1 mg/dL (ref 0.60–1.30)
EGFR (African American): >60
Glucose: 122 mg/dL — ABNORMAL HIGH (ref 65–99)
OSMOLALITY: 280 (ref 275–301)
Potassium: 3.8 mmol/L (ref 3.5–5.1)
Sodium: 140 mmol/L (ref 136–145)

## 2014-01-31 LAB — DRUG SCREEN, URINE
Amphetamines, Ur Screen: NEGATIVE (ref ?–1000)
BENZODIAZEPINE, UR SCRN: NEGATIVE (ref ?–200)
Barbiturates, Ur Screen: NEGATIVE (ref ?–200)
CANNABINOID 50 NG, UR ~~LOC~~: NEGATIVE (ref ?–50)
Cocaine Metabolite,Ur ~~LOC~~: NEGATIVE (ref ?–300)
MDMA (ECSTASY) UR SCREEN: NEGATIVE (ref ?–500)
METHADONE, UR SCREEN: NEGATIVE (ref ?–300)
OPIATE, UR SCREEN: NEGATIVE (ref ?–300)
PHENCYCLIDINE (PCP) UR S: NEGATIVE (ref ?–25)
TRICYCLIC, UR SCREEN: NEGATIVE (ref ?–1000)

## 2014-01-31 LAB — LIPASE, BLOOD: Lipase: 127 U/L (ref 73–393)

## 2014-02-01 ENCOUNTER — Inpatient Hospital Stay: Payer: Self-pay | Admitting: Internal Medicine

## 2014-02-01 LAB — HEPATIC FUNCTION PANEL A (ARMC)
ALK PHOS: 68 U/L
Albumin: 3.8 g/dL (ref 3.4–5.0)
Bilirubin, Direct: 0.5 mg/dL — ABNORMAL HIGH (ref 0.00–0.20)
Bilirubin,Total: 1.1 mg/dL — ABNORMAL HIGH (ref 0.2–1.0)
SGOT(AST): 28 U/L (ref 15–37)
SGPT (ALT): 37 U/L (ref 12–78)
Total Protein: 7.9 g/dL (ref 6.4–8.2)

## 2014-02-12 ENCOUNTER — Inpatient Hospital Stay: Payer: Self-pay | Admitting: Surgery

## 2014-02-13 LAB — COMPREHENSIVE METABOLIC PANEL
ALK PHOS: 59 U/L
AST: 21 U/L (ref 15–37)
Albumin: 3 g/dL — ABNORMAL LOW (ref 3.4–5.0)
Anion Gap: 7 (ref 7–16)
BUN: 13 mg/dL (ref 7–18)
Bilirubin,Total: 1.2 mg/dL — ABNORMAL HIGH (ref 0.2–1.0)
CREATININE: 1.19 mg/dL (ref 0.60–1.30)
Calcium, Total: 8.8 mg/dL (ref 8.5–10.1)
Chloride: 101 mmol/L (ref 98–107)
Co2: 29 mmol/L (ref 21–32)
EGFR (Non-African Amer.): >60
Glucose: 130 mg/dL — ABNORMAL HIGH (ref 65–99)
Osmolality: 276 (ref 275–301)
POTASSIUM: 4.2 mmol/L (ref 3.5–5.1)
SGPT (ALT): 23 U/L (ref 12–78)
Sodium: 137 mmol/L (ref 136–145)
Total Protein: 6.5 g/dL (ref 6.4–8.2)

## 2014-02-13 LAB — CBC WITH DIFFERENTIAL/PLATELET
BASOS PCT: 0.1 %
Basophil #: 0 10*3/uL (ref 0.0–0.1)
EOS ABS: 0 10*3/uL (ref 0.0–0.7)
EOS PCT: 0 %
HCT: 41.5 % (ref 40.0–52.0)
HGB: 14.1 g/dL (ref 13.0–18.0)
LYMPHS PCT: 9.5 %
Lymphocyte #: 1.1 10*3/uL (ref 1.0–3.6)
MCH: 31.5 pg (ref 26.0–34.0)
MCHC: 34 g/dL (ref 32.0–36.0)
MCV: 93 fL (ref 80–100)
Monocyte #: 1.2 x10 3/mm — ABNORMAL HIGH (ref 0.2–1.0)
Monocyte %: 10 %
Neutrophil #: 9.3 10*3/uL — ABNORMAL HIGH (ref 1.4–6.5)
Neutrophil %: 80.4 %
Platelet: 266 10*3/uL (ref 150–440)
RBC: 4.47 10*6/uL (ref 4.40–5.90)
RDW: 13.3 % (ref 11.5–14.5)
WBC: 11.6 10*3/uL — ABNORMAL HIGH (ref 3.8–10.6)

## 2014-02-14 LAB — BASIC METABOLIC PANEL
ANION GAP: 6 — AB (ref 7–16)
BUN: 11 mg/dL (ref 7–18)
CREATININE: 0.8 mg/dL (ref 0.60–1.30)
Calcium, Total: 9.1 mg/dL (ref 8.5–10.1)
Chloride: 101 mmol/L (ref 98–107)
Co2: 28 mmol/L (ref 21–32)
EGFR (African American): >60
EGFR (Non-African Amer.): >60
Glucose: 91 mg/dL (ref 65–99)
Osmolality: 269 (ref 275–301)
POTASSIUM: 3.9 mmol/L (ref 3.5–5.1)
Sodium: 135 mmol/L — ABNORMAL LOW (ref 136–145)

## 2014-02-14 LAB — CBC WITH DIFFERENTIAL/PLATELET
BASOS ABS: 0.1 10*3/uL (ref 0.0–0.1)
Basophil %: 0.6 %
EOS PCT: 1.3 %
Eosinophil #: 0.1 10*3/uL (ref 0.0–0.7)
HCT: 40.4 % (ref 40.0–52.0)
HGB: 13.8 g/dL (ref 13.0–18.0)
Lymphocyte #: 1.3 10*3/uL (ref 1.0–3.6)
Lymphocyte %: 14.2 %
MCH: 31.5 pg (ref 26.0–34.0)
MCHC: 34.1 g/dL (ref 32.0–36.0)
MCV: 92 fL (ref 80–100)
MONOS PCT: 8.5 %
Monocyte #: 0.8 x10 3/mm (ref 0.2–1.0)
NEUTROS ABS: 7.1 10*3/uL — AB (ref 1.4–6.5)
Neutrophil %: 75.4 %
Platelet: 252 10*3/uL (ref 150–440)
RBC: 4.37 10*6/uL — ABNORMAL LOW (ref 4.40–5.90)
RDW: 12.8 % (ref 11.5–14.5)
WBC: 9.4 10*3/uL (ref 3.8–10.6)

## 2014-02-14 LAB — PATHOLOGY REPORT

## 2014-02-16 LAB — BASIC METABOLIC PANEL
Anion Gap: 6 — ABNORMAL LOW (ref 7–16)
BUN: 6 mg/dL — AB (ref 7–18)
CALCIUM: 9 mg/dL (ref 8.5–10.1)
CREATININE: 0.91 mg/dL (ref 0.60–1.30)
Chloride: 95 mmol/L — ABNORMAL LOW (ref 98–107)
Co2: 32 mmol/L (ref 21–32)
EGFR (African American): >60
EGFR (Non-African Amer.): >60
Glucose: 149 mg/dL — ABNORMAL HIGH (ref 65–99)
OSMOLALITY: 267 (ref 275–301)
POTASSIUM: 3.7 mmol/L (ref 3.5–5.1)
Sodium: 133 mmol/L — ABNORMAL LOW (ref 136–145)

## 2014-02-16 LAB — CBC WITH DIFFERENTIAL/PLATELET
Basophil #: 0 10*3/uL (ref 0.0–0.1)
Basophil %: 0.3 %
EOS PCT: 2.8 %
Eosinophil #: 0.2 10*3/uL (ref 0.0–0.7)
HCT: 46.6 % (ref 40.0–52.0)
HGB: 16.3 g/dL (ref 13.0–18.0)
Lymphocyte #: 0.8 10*3/uL — ABNORMAL LOW (ref 1.0–3.6)
Lymphocyte %: 9.1 %
MCH: 31.8 pg (ref 26.0–34.0)
MCHC: 34.9 g/dL (ref 32.0–36.0)
MCV: 91 fL (ref 80–100)
Monocyte #: 1.1 x10 3/mm — ABNORMAL HIGH (ref 0.2–1.0)
Monocyte %: 13.4 %
NEUTROS ABS: 6.2 10*3/uL (ref 1.4–6.5)
NEUTROS PCT: 74.4 %
PLATELETS: 333 10*3/uL (ref 150–440)
RBC: 5.11 10*6/uL (ref 4.40–5.90)
RDW: 12.7 % (ref 11.5–14.5)
WBC: 8.3 10*3/uL (ref 3.8–10.6)

## 2014-02-20 LAB — PLATELET COUNT: Platelet: 318 10*3/uL (ref 150–440)

## 2014-10-30 ENCOUNTER — Emergency Department: Payer: Self-pay | Admitting: Emergency Medicine

## 2014-10-30 LAB — LIPASE, BLOOD: LIPASE: 448 U/L — AB (ref 73–393)

## 2014-10-30 LAB — CBC WITH DIFFERENTIAL/PLATELET
Basophil #: 0.1 10*3/uL (ref 0.0–0.1)
Basophil %: 0.8 %
EOS PCT: 9.6 %
Eosinophil #: 0.8 10*3/uL — ABNORMAL HIGH (ref 0.0–0.7)
HCT: 48.2 % (ref 40.0–52.0)
HGB: 16.1 g/dL (ref 13.0–18.0)
LYMPHS PCT: 30.5 %
Lymphocyte #: 2.5 10*3/uL (ref 1.0–3.6)
MCH: 31.9 pg (ref 26.0–34.0)
MCHC: 33.4 g/dL (ref 32.0–36.0)
MCV: 95 fL (ref 80–100)
Monocyte #: 0.8 x10 3/mm (ref 0.2–1.0)
Monocyte %: 10.2 %
NEUTROS ABS: 4 10*3/uL (ref 1.4–6.5)
NEUTROS PCT: 48.9 %
Platelet: 251 10*3/uL (ref 150–440)
RBC: 5.05 10*6/uL (ref 4.40–5.90)
RDW: 12.9 % (ref 11.5–14.5)
WBC: 8.2 10*3/uL (ref 3.8–10.6)

## 2014-10-30 LAB — COMPREHENSIVE METABOLIC PANEL
ALT: 58 U/L
AST: 40 U/L — AB (ref 15–37)
Albumin: 3.6 g/dL (ref 3.4–5.0)
Alkaline Phosphatase: 101 U/L
Anion Gap: 4 — ABNORMAL LOW (ref 7–16)
BUN: 18 mg/dL (ref 7–18)
Bilirubin,Total: 0.5 mg/dL (ref 0.2–1.0)
CALCIUM: 8.6 mg/dL (ref 8.5–10.1)
CHLORIDE: 108 mmol/L — AB (ref 98–107)
Co2: 30 mmol/L (ref 21–32)
Creatinine: 1.26 mg/dL (ref 0.60–1.30)
EGFR (African American): >60
Glucose: 94 mg/dL (ref 65–99)
Osmolality: 285 (ref 275–301)
Potassium: 3.8 mmol/L (ref 3.5–5.1)
Sodium: 142 mmol/L (ref 136–145)
TOTAL PROTEIN: 7.7 g/dL (ref 6.4–8.2)

## 2015-02-08 NOTE — Consult Note (Signed)
PATIENT NAME:  BASSEL, GASKILL MR#:  [id number] DATE OF BIRTH:  [birth year]  DATE OF CONSULTATION:  09/11/2013  REFERRING PHYSICIAN:  Dr. Dustin Flock.  CONSULTING PHYSICIAN:  Andria Meuse, NP   PRIMARY CARE PROVIDER:  Not applicable.   REASON FOR CONSULTATION: Diverticulitis and elevated lipase.   HISTORY OF PRESENT ILLNESS: Mr. Guile is a pleasant 49 year old black male who was admitted to [facility] 10/31 through 08/19/2013, with acute uncomplicated sigmoid diverticulitis and hypertension. He was started on Cipro and Flagyl and sent home with Cipro 500 mg b.i.d. for 12 days and Flagyl 500 mg t.i.d. for 12 days for a total of 14 days of antibiotics. He tells me he was fine until he completed his antibiotics. About 3 days ago, he began to "just feel bad." He went to work and felt like he kept having to have a bowel movement but was only passing small amounts of stool. He began to have nausea, vomiting and diarrhea and 10 out of 10 mid abdominal pain. He had vomiting Saturday morning and evening, no vomiting yesterday and continued to feel worse today. He has had chills but no fever. His appetite has been very poor. He denies any diarrhea or rectal bleeding. He denies any history of alcohol abuse. He does smoke marijuana but has not smoked since prior to the last time he was in the hospital. He reports a 30 pound weight loss in the last 3 weeks. Lipase on admission was 1018, total bilirubin is 1.2 now. It was as high as 1.9 back in 2010, 1.5 on 10/30. He has an AST of 44 now. His white blood cell count was 14,000 on October 31st. It is down to 10.9 today. He was started on Cipro b.i.d. and Flagyl t.i.d. upon admission. He had a CT scan of the abdomen and pelvis with IV and oral contrast, which showed uncomplicated, unchanged left lower quadrant sigmoid diverticulitis.   PAST MEDICAL AND SURGICAL HISTORY: Hypertension, diverticulitis, 08/18/2013; left finger surgery with tendon repair.   MEDICATIONS  PRIOR TO ADMISSION: Aspirin 81 mg daily, hydralazine 25 mg t.i.d., metoprolol 50 mg b.i.d., nicotine patch.   ALLERGIES: No known drug allergies.   FAMILY HISTORY: There is no known family history of colon carcinoma, liver or chronic GI problems. There is family history of diabetes mellitus.   SOCIAL HISTORY: He has been a smoker since age 36. He is a Nature conservation officer. Recent marijuana use 3 weeks ago. Denies any alcohol use.   REVIEW OF SYSTEMS: See HPI, otherwise negative 10-point review of systems.     PHYSICAL EXAMINATION: VITAL SIGNS: Temp 98.2, pulse 74, respirations 18, blood pressure 151/94.  GENERAL: He is a well-developed, well-nourished black male who is in no acute distress.  HEENT: Sclerae clear, nonicteric. Conjunctivae pink. Oropharynx pink and moist without any lesions.  NECK: Supple without mass or thyromegaly.  CHEST: Heart regular rate and rhythm. Normal S1, S2, without murmurs, clicks, rubs or gallops.  LUNGS: Clear to auscultation bilaterally.  ABDOMEN: Positive bowel sounds x 4. No bruits auscultated. Abdomen is soft. He has moderate tenderness to the umbilicus and left lower quadrant. No rebound, tenderness or guarding. No hepatosplenomegaly or mass.   EXTREMITIES: Without edema. He does have clubbing.  SKIN: Warm and dry without any rash or jaundice.  NEUROLOGIC: Grossly intact.  MUSCULOSKELETAL: Good equal movement and strength bilaterally.  PSYCHIATRIC: Alert, oriented. Normal mood and affect.   LABORATORY STUDIES: Glucose 118, BUN 21, anion gap 6, otherwise normal met-7. Lipase  1018, total protein 8.9, total bilirubin 1.2, AST 44, otherwise normal LFTs. White blood cell count 10.9, hemoglobin 16.9, hematocrit 48.5, platelets 327. Urinalysis shows 1+ blood, 30 mg/dL of protein, 6 RBCs and 3 WBCs.   IMPRESSION: Mr. Jasinski is a pleasant 49 year old male who was admitted with persistent uncomplicated sigmoid diverticulitis and elevated lipase. There was no  evidence of pancreatitis on CT. He had completed a 2 week course of Cipro and Flagyl last week and began to have recurrent abdominal pain, nausea and vomiting after completing treatment.   PLAN: 1.  Discontinue Cipro and Flagyl.  2.  Change to Zosyn 3.375 mg q.6 hours IV.    3.  Consider Augmentin 875 mg p.o. b.i.d. for 14 days at discharge if he improves with Zosyn.  4.  Short interval colonoscopy followup to rule out malignancy.  5.  Agree with PPI.   We would like to thank you for allowing [place] to participate in the care of Mr. Prochnow.    ____________________________ Andria Meuse, NP klj:dmm D: 09/11/2013 22:35:40 ET T: 09/11/2013 23:03:12 ET JOB#: [id number]  cc: Andria Meuse, NP, <Dictator> Andria Meuse FNP ELECTRONICALLY SIGNED 09/25/2013 15:32

## 2015-02-08 NOTE — Discharge Summary (Signed)
PATIENT NAME:  Dominic Foley, Dominic Foley MR#:  [id number] DATE OF BIRTH:  [birth year]  DATE OF ADMISSION:  09/11/2013 DATE OF DISCHARGE:  09/14/2013  ADMITTING PHYSICIAN: Dustin Flock, MD  DISCHARGING PHYSICIAN: Gladstone Lighter, MD  PRIMARY CARE PHYSICIAN: Currently none. Used to go to [facility] in the past.  DISCHARGE DIAGNOSES: 1.  Recurrent sigmoid diverticulitis.  2.  Accelerated hypertension.  3.  Tobacco use disorder.   DISCHARGE HOME MEDICATIONS:  1.  Aspirin 81 mg p.o. daily.  2.  Zofran 4 mg oral disintegrating tablet 1 tablet q. 6 hours p.r.n. for nausea and vomiting.  3.  Metoprolol 50 mg p.o. b.i.d.  4.  Hydralazine 50 mg p.o. 3 times a day.  5.  Augmentin 875 mg p.o. twice a day for 7 more days.  6.  Nicotine patch 21 mg transdermal once a day.   DISCHARGE DIET: Low sodium, low residue.  DISCHARGE ACTIVITY: As tolerated.   FOLLOWUP INSTRUCTIONS: PCP followup in 2 weeks.   CONSULTATIONS IN THE HOSPITAL: Gastroenterology by Dr. Lucilla Lame.  LABS AND IMAGING STUDIES PRIOR TO DISCHARGE: WBC 7.9, hemoglobin 16.3, hematocrit 46.3, platelet count is 291.   Sodium 137, potassium 3.3, chloride 102, bicarb 26, BUN 10, creatinine 0.94, glucose 88, and calcium of 9.2.   ALT 74, AST 60, alk phos 66, total bili 1.2, and albumin of 3.6. Lipase was within normal limits at 139.   CT of the abdomen on admission showing mild diverticulitis in the proximal sigmoid colon region. No evidence of abscess or other complications.   Urinalysis negative for any infection.   BRIEF HOSPITAL COURSE: Dominic Foley is a 49 year old African American male with past medical history significant for hypertension who has not been taking his medications for more than 2 years now and also history of diverticulosis who presents with acute diverticulitis symptoms on admission.  1.  Acute diverticulitis on admission. Initially thought to be pancreatitis with elevated lipase, but probably reactive  elevation with CT showing completely normal pancreas. He was kept n.p.o., fluids were started, pain management, and nausea medications. Finally, he was able to tolerate a full liquid diet and soft diet prior to discharge. His antibiotics were changed around in the hospital. He was on IV Zosyn with improvement. That was changed over to Augmentin prior to discharge. 2.  Accelerated hypertension. The patient has not been taking any of his medications for more than 2 years. His blood pressure was very elevated for the initial few days in the hospital and finally better controlled with metoprolol and hydralazine. He was taught the importance of being compliant with his medications and recommended outpatient followup. 3.  Tobacco use disorder. He was counseled on admission. He was on nicotine patch in the hospital and is being discharged on the same. His course has been otherwise uneventful.   DISCHARGE CONDITION: Stable.   DISCHARGE DISPOSITION: Home.   TIME SPENT ON DISCHARGE: 40 minutes.  ____________________________ Gladstone Lighter, MD rk:sb D: 09/15/2013 11:17:23 ET T: 09/15/2013 11:40:44 ET JOB#: [id number]  cc: Gladstone Lighter, MD, <Dictator> Gladstone Lighter MD ELECTRONICALLY SIGNED 10/05/2013 7:23

## 2015-02-08 NOTE — Consult Note (Signed)
Chief Complaint:  Subjective/Chief Complaint Pt denies any abdominal pain.  Denies nausea or vomiting.  He has had 2 loose stools this morning.   VITAL SIGNS/ANCILLARY NOTES: **Vital Signs.:   25-Nov-14 05:17  Vital Signs Type Routine  Temperature Temperature (F) 98.6  Celsius 37  Temperature Source oral  Pulse Pulse 53  Respirations Respirations 20  Systolic BP Systolic BP 453  Diastolic BP (mmHg) Diastolic BP (mmHg) 89  Mean BP 108  Pulse Ox % Pulse Ox % 99  Pulse Ox Activity Level  At rest  Oxygen Delivery Room Air/ 21 %   Brief Assessment:  GEN well developed, well nourished, no acute distress, A/Ox3.   Cardiac Regular   Respiratory normal resp effort   Gastrointestinal details normal Soft  Nontender  Nondistended  Bowel sounds normal  No rebound tenderness  No gaurding   EXTR negative edema, no cyanosis. +clubbing.   Additional Physical Exam Skin: warm, dry, no rash   Lab Results: Routine Chem:  25-Nov-14 05:38   Glucose, Serum  100  Creatinine (comp) 1.11  Sodium, Serum 139  Potassium, Serum 4.1  Chloride, Serum 107  CO2, Serum 28  Calcium (Total), Serum 9.0  Anion Gap  4  Osmolality (calc) 280  eGFR (African American) >60  eGFR (Non-African American) >60 (eGFR values <11m/min/1.73 m2 may be an indication of chronic kidney disease (CKD). Calculated eGFR is useful in patients with stable renal function. The eGFR calculation will not be reliable in acutely ill patients when serum creatinine is changing rapidly. It is not useful in  patients on dialysis. The eGFR calculation may not be applicable to patients at the low and high extremes of body sizes, pregnant women, and vegetarians.)  Cholesterol, Serum 116  Triglycerides, Serum 87  HDL (INHOUSE)  26  VLDL Cholesterol Calculated 17  LDL Cholesterol Calculated 73 (Result(s) reported on 12 Sep 2013 at 06:27AM.)  Routine Hem:  25-Nov-14 05:38   WBC (CBC) 8.0  RBC (CBC) 4.50  Hemoglobin (CBC) 14.3   Hematocrit (CBC) 41.1  Platelet Count (CBC) 263  MCV 91  MCH 31.8  MCHC 34.8  RDW 12.3  Neutrophil % 51.5  Lymphocyte % 30.7  Monocyte % 14.6  Eosinophil % 2.6  Basophil % 0.6  Neutrophil # 4.1  Lymphocyte # 2.5  Monocyte #  1.2  Eosinophil # 0.2  Basophil # 0.0 (Result(s) reported on 12 Sep 2013 at 06:40AM.)   Radiology Results: CT:    24-Nov-14 19:07, CT Abdomen and Pelvis With Contrast  CT Abdomen and Pelvis With Contrast   REASON FOR EXAM:    (1) pain; (2) pain /nausea and vomiting  COMMENTS:       PROCEDURE: CT  - CT ABDOMEN / PELVIS  W  - Sep 11 2013  7:07PM     CLINICAL DATA:  Abdominal pain. Nausea and vomiting. Diverticulitis.    EXAM:  CT ABDOMEN AND PELVIS WITH CONTRAST    TECHNIQUE:  Multidetector CT imaging of the abdomen and pelvis was performed  using the standard protocol following bolus administration of  intravenous contrast.  CONTRAST:  125 cc Isovue 370    COMPARISON:  08/17/2013    FINDINGS:  Mild diverticulitis is again seen involving the proximal sigmoid  colon, without significant change. No evidence of abscess,  extraluminal air, or free fluid. No other inflammatory process  identified. Normal appendix visualized. No evidence of dilated bowel  loops.    The liver, gallbladder, pancreas, spleen, and adrenal glands remain  normal in appearance. Small renal cysts are again noted bilaterally  however there is no evidence of renal masses or hydronephrosis.  Shotty less than 1 cm retroperitoneal lymph nodes noted and left  paraaortic region, however none are pathologically enlarged. No  suspicious bone lesions identified.     IMPRESSION:  Mild diverticulitis involving the proximal sigmoid colon, without  significant change. No evidence ofabscess or other complication.      Electronically Signed    By: Earle Gell M.D.    On: 09/11/2013 19:12         Verified By: Marlaine Hind, M.D.,   Assessment/Plan:  Assessment/Plan:   Assessment Persistent uncomplicated sigmoid diverticulitis:  Improving.  Will need FU colonoscopy to r/o malignancy. Elevated lipase:  without evidence of pancreatitis on CT.   Plan 1) Add lipase on to todays labs 2) Continue zosyn 3) If able to tolerate POs, consider changing to Augmentin 832m PO daily for 14 days 4) Outpatient colonoscopy 5) Clear liquids, Advance diet as tolerated  Please call if you have any questions or concerns   Electronic Signatures: JAndria Meuse(NP)  (Signed [phone]:19)  Authored: Chief Complaint, VITAL SIGNS/ANCILLARY NOTES, Brief Assessment, Lab Results, Radiology Results, Assessment/Plan   Last Updated: 25-Nov-14 10:19 by JAndria Meuse(NP)

## 2015-02-08 NOTE — Consult Note (Signed)
Chief Complaint:  Subjective/Chief Complaint seen for recurrent diverticulitis.  denies nausea and abdominal apin today, tolerating clears.   VITAL SIGNS/ANCILLARY NOTES: **Vital Signs.:   27-Nov-14 04:40  Temperature Temperature (F) 98.7    07:23  Vital Signs Type Recheck  Systolic BP Systolic BP 229  Diastolic BP (mmHg) Diastolic BP (mmHg) 80  Mean BP 102  BP Source  if not from Vital Sign Device manual   Brief Assessment:  Cardiac Regular   Respiratory clear BS   Gastrointestinal details normal Soft  Nontender  Nondistended  Bowel sounds normal   Lab Results: Hepatic:  27-Nov-14 04:21   Bilirubin, Total  1.2  Alkaline Phosphatase 66 (45-117 NOTE: New Reference Range 09/08/13)  SGPT (ALT) 74  SGOT (AST)  60  Total Protein, Serum 7.8  Albumin, Serum 3.6  Routine Chem:  27-Nov-14 04:21   Glucose, Serum 88  Creatinine (comp) 0.94  Sodium, Serum 137  Potassium, Serum  3.3  Chloride, Serum 102  CO2, Serum 26  Calcium (Total), Serum 9.2  Osmolality (calc) 272  eGFR (African American) >60  eGFR (Non-African American) >60 (eGFR values <23m/min/1.73 m2 may be an indication of chronic kidney disease (CKD). Calculated eGFR is useful in patients with stable renal function. The eGFR calculation will not be reliable in acutely ill patients when serum creatinine is changing rapidly. It is not useful in  patients on dialysis. The eGFR calculation may not be applicable to patients at the low and high extremes of body sizes, pregnant women, and vegetarians.)  Anion Gap 9  Lipase 196 (Result(s) reported on 14 Sep 2013 at 05:10AM.)  Routine Hem:  27-Nov-14 04:21   WBC (CBC) 7.9  RBC (CBC) 5.16  Hemoglobin (CBC) 16.3  Hematocrit (CBC) 46.3  Platelet Count (CBC) 291  MCV 90  MCH 31.6  MCHC 35.2  RDW 12.2  Neutrophil % 51.5  Lymphocyte % 31.2  Monocyte % 16.1  Eosinophil % 0.2  Basophil % 1.0  Neutrophil # 4.1  Lymphocyte # 2.5  Monocyte #  1.3  Eosinophil # 0.0   Basophil # 0.1 (Result(s) reported on 14 Sep 2013 at 04:57AM.)   Assessment/Plan:  Assessment/Plan:  Assessment 1) recurrent diverticulitis-feels some better today.  now on invanz   Plan 1) agree with change to augmentin prior to d/c, finish 10 day course of antibiotics.  he will need to have o/p followup with Dr WAllen Norris2 weeks after d/c, possible colonoscopy as o/p.   Dr OCandace Cruiseto see tomorrow.   Electronic Signatures: SLoistine Simas(MD)  (Signed [phone])  Authored: Chief Complaint, VITAL SIGNS/ANCILLARY NOTES, Brief Assessment, Lab Results, Assessment/Plan   Last Updated: 27-Nov-14 15:04 by SLoistine Simas(MD)

## 2015-02-08 NOTE — Consult Note (Signed)
Brief Consult Note: Diagnosis: Persistent Diverticulitis.   Patient was seen by consultant.   Consult note dictated.   Comments: Mr. Dominic Foley is a pleasant 49 y/o male who was admitted with persistent uncomplicated sigmoid diverticulitis & an elevated lipase.  There was no evidence of pancreatitis on CT.  He had completed 2 week course of cipro & flagyl last week & began to have recurrent abdominal pain, nausea & vomiting after completing treatment.    Plan: 1) DC Cipro/Flagyl 2) Change to Zosyn 3.375grams q6hrs IV 3) Consider Augmentin 875mg  PO BID x 14 days at discharge if improves with zosyn 4) Short-interval colonoscopy follow-up to r/o malignancy 5) Agree with PPI 6) CBC, CMP, lipase in AM  Thanks for consult.  Please see full dictated note. #[id number].  Electronic Signatures: Dominic Foley, Dominic Foley (NP)  (Signed [phone]-Nov-14 22:37)  Authored: Brief Consult Note   Last Updated: 24-Nov-14 22:37 by Dominic Foley, Dominic Foley (NP)

## 2015-02-08 NOTE — Consult Note (Signed)
Chief Complaint:  Subjective/Chief Complaint Pt c/o 2/10 LLQ pain worse with movement.  Some early morning nausea & diaphoresis with sitting up.  He has had a soft BM this AM.  Denies melena or rectal bleeding.  Tolerating full liquids well.   VITAL SIGNS/ANCILLARY NOTES: **Vital Signs.:   26-Nov-14 05:20  Vital Signs Type Routine  Temperature Temperature (F) 98.1  Celsius 36.7  Temperature Source oral  Pulse Pulse 60  Respirations Respirations 20  Systolic BP Systolic BP 168  Diastolic BP (mmHg) Diastolic BP (mmHg) 98  Mean BP 121  Pulse Ox % Pulse Ox % 99  Pulse Ox Activity Level  At rest  Oxygen Delivery Room Air/ 21 %   Brief Assessment:  GEN well developed, well nourished, no acute distress, A/Ox3.   Cardiac Regular   Respiratory normal resp effort   Gastrointestinal details normal Soft  Nontender  Nondistended  Bowel sounds normal  No rebound tenderness  No gaurding   EXTR negative edema, no cyanosis. +clubbing.   Additional Physical Exam Skin: warm, dry, no rash   Assessment/Plan:  Assessment/Plan:  Assessment Persistent uncomplicated sigmoid diverticulitis:  Improving.  Will need FU colonoscopy to r/o malignancy. Elevated lipase: Resolved.  NO evidence of pancreatitis on CT.   Plan 1) Change to PO Augmentin 875mg  daily for 14 days 2) Outpatient colonoscopy with Dr Servando SnareWOHL in 6 weeks 3) Advance diet as tolerated  Please call if you have any questions or concerns.  Dr Marva PandaSkulskie will be covering in our absence tomorrow.   Electronic Signatures: Joselyn ArrowJones, Ernestyne Caldwell L (NP)  (Signed [phone]-Nov-14 09:53)  Authored: Chief Complaint, VITAL SIGNS/ANCILLARY NOTES, Brief Assessment, Assessment/Plan   Last Updated: 26-Nov-14 09:53 by Joselyn ArrowJones, Alfrieda Tarry L (NP)

## 2015-02-08 NOTE — H&P (Signed)
PATIENT NAME:  Dominic Foley, Dominic Foley MR#:  [id number] DATE OF BIRTH:  1965-10-27  DATE OF ADMISSION:  09/11/2013  PRIMARY CARE PROVIDER: None. Used to go to [facility]. None now.   EMERGENCY ROOM  REFERRING PHYSICIAN: Harvest Dark, MD  CHIEF COMPLAINT: Nausea, vomiting, abdominal pain.   HISTORY OF PRESENT ILLNESS: The patient is a pleasant 49 year old African American male with history of hypertension, who was recently hospitalized on October 31 with abdominal pain and was diagnosed with acute diverticulitis. The patient was discharged the next day with p.o. antibiotics for two weeks. He reports that after finishing the antibiotics, he started to feel bad with nausea and vomiting and started having abdominal pain. He has not been able to keep any food down and comes to the ED.   In the ER, he was re-evaluated, noted to have a slightly elevated WBC count and also was noted to have a lipase in the 1000-range. The patient reports that he does not drink at all. Last drink was about two years ago at a party. He has no history of pancreatitis. A look reviewing his CT back that was done on the 31st showed no evidence of gallstones or any other abnormalities. The patient is not on any medications that would cause pancreatitis, either. He is only on metoprolol. He ran out of the hydralazine.   He otherwise denies any fevers, chills. No diarrhea. He complains of epigastric pain, especially with throwing up, but it is not constant. He has not had any other symptoms of chest pain or shortness of breath.   PAST MEDICAL HISTORY: Hypertension and history of diverticulitis.   PAST SURGICAL HISTORY: Left hand tendon repair.   ALLERGIES: None.   CURRENT MEDICATIONS: The patient is only taking the metoprolol tartrate 50 mg q.12.   SOCIAL HISTORY: Smokes about 3/4 pack to 1 pack per day. Denies any drinking or illicit drugs. 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 Family history of  hypertension.   REVIEW OF SYSTEMS:  CONSTITUTIONAL: Denies any fevers, fatigue, weakness. Complains of abdominal pain. No weight gain or weight loss.  EYES: No blurred or double vision. No pain. No redness. No inflammation. No glaucoma. No cataracts.  ENT: No tinnitus. No ear pain. No hearing loss. No seasonal or year-round allergies. No nasal discharge. No postnasal drip. No sinus pain. No dentures. No difficulty swallowing.  RESPIRATORY: Denies any cough, wheezing, hemoptysis. No COPD. No TB.  CARDIOVASCULAR: Denies any chest pain, orthopnea, edema or dyspnea on exertion. No palpitations.  GASTROINTESTINAL: Complains of some nausea and vomiting, some abdominal pain. No hematemesis. No melena.  GENITOURINARY: Denies any dysuria, hematuria, renal calc, or frequency.  ENDOCRINE: Denies any polydrug, nocturia or thyroid problems.  HEMATOLOGIC AND LYMPHATIC: Denies any anemia, easy bruisability or bleeding.  SKIN: No acne. No rash. No changes in mole, hair or skin.  MUSCULOSKELETAL: Denies any pain in the neck, back or shoulder.  NEUROLOGIC: No numbness. No CVA. No TIA. No seizures.  PSYCHIATRIC: No anxiety. No insomnia. No ADD.   PHYSICAL EXAMINATION:  VITAL SIGNS: Temperature 98.2, pulse 112, respirations 20, blood pressure 135/93, O2 98%.  GENERAL: A well-developed African American male in no acute distress.  HEENT: Head atraumatic, normocephalic. Pupils equally round, reactive to light and accommodation. There is no conjunctival pallor. No scleral icterus. Nasal exam shows no drainage or ulceration.  OROPHARYNX: Clear without any exudate.  NECK: Supple without any JVD.  CARDIOVASCULAR: Regular rate and rhythm. No murmurs, rubs, clicks or  gallops. PMI is not displaced.  ABDOMEN: Soft. He has some epigastric tenderness. No guarding, no rebound. Positive bowel sounds x4. No hepatosplenomegaly.  GENITOURINARY: Deferred.  MUSCULOSKELETAL: No erythema or swelling.  SKIN: No rash.  LYMPHATICS:  No lymph nodes palpable.  VASCULAR: Good DP, PT pulses.  PSYCHIATRIC: Not anxious or depressed.  NEUROLOGIC: Awake, alert, oriented x3. No focal deficits.   LABORATORY DATA: Glucose 118, BUN 21, creatinine 1.09, sodium 137, potassium 3.5, chloride 104. CO2 is 27. Lipase is 1018. LFTs: Total protein 8.9, albumin 4.2, bilirubin total 1.2, alk phos 85. AST is 44 and ALT 57. Troponin less than 0.02. WBC 10.9, hemoglobin 16.9, platelet count 327.   ASSESSMENT AND PLAN: The patient is a 49 year old African American male admitted recently with abdominal pain diagnosed with diverticulitis, who, after finishing antibiotics, started having  nausea, vomiting, epigastric discomfort, now noted to have a lipase of 1000-range.  1.  Possible acute pancreatitis, unclear etiology. The patient denies alcohol. His recent CT did not show any gallbladder abnormalities including gallstones. Common bile duct was normal. At this time, we will keep him n.p.o. Will check a lipid panel in the a.m. Will have a gastrointestinal  evaluation regarding his symptoms.  2.  Recent episode of diverticulitis. I am going to repeat a CT of the abdomen which will also evaluate the pancreas. At this time, I will place him on Cipro and Flagyl for the time being.  3.  Hypertension. We will continue p.o. metoprolol and p.r.n. hydralazine.  4.  Nicotine addiction. The patient was counseled regarding smoking cessation. Nicotine patch was offered to the patient. He wants to try that as previous. Three minutes spent on counseling the patient regarding smoking cessation.   Please note H and P.   TIME SPENT: 55 minutes.     ____________________________ Lafonda Mosses Posey Pronto, MD shp:np D: 09/11/2013 15:04:35 ET T: 09/11/2013 15:41:41 ET JOB#: [id number]  cc: Ellouise Mcwhirter H. Posey Pronto, MD, <Dictator> Alric Seton MD ELECTRONICALLY SIGNED 09/25/2013 8:28

## 2015-02-08 NOTE — H&P (Signed)
PATIENT NAME:  Dominic Foley, Dominic Foley  DATE OF ADMISSION:  08/18/2013  PRIMARY CARE PHYSICIAN: None.   REFERRING PHYSICIAN: Dr. Zenda AlpersWebster.   CHIEF COMPLAINT: Nausea, vomiting, abdominal pain.   HISTORY OF PRESENT ILLNESS: Mr. Dominic Foley is a 49 year old African American male with past medical history of hypertension who is off of medication for the last 2 years. Comes to the Emergency Department with complaints of nausea and vomiting for the last 3 days. Unable to keep down any food, associated with mild abdominal pain. Had last bowel movement 2 days back which was dark in color. Workup in the Emergency Department, CT abdomen and pelvis shows inflammation in the sigmoid area concerning about diverticulitis and also shows diverticulosis. The patient never had a colonoscopy in the past. Denies having any blood in the stool. Denies having any loss of weight. Denies having any sick contacts or eating any food from outside. Also, the patient is found to have high blood pressure with systolic blood pressure over 200. The patient received 20 mg of hydralazine in the Emergency Department with some improvement of the blood pressure to 180/95.   PAST MEDICAL HISTORY: Hypertension.   PAST SURGICAL HISTORY: Left hand tendon repair.    ALLERGIES: No known drug allergies.   CURRENT MEDICATIONS: None.   SOCIAL HISTORY: Smokes about 3/4 pack to 1 pack a day. Denies drinking alcohol or using illicit drugs. Lives by himself. Works as a Corporate investment bankerconstruction worker.   FAMILY HISTORY: Hypertension.   REVIEW OF SYSTEMS:  CONSTITUTIONAL: Generalized weakness.  EYES: No change in vision.  ENT: No change in hearing.  RESPIRATORY: No cough, shortness of breath.  CARDIOVASCULAR: No chest pain, palpitations.  GASTROINTESTINAL: Has nausea, vomiting, abdominal pain.  GENITOURINARY: No dysuria or hematuria.  ENDOCRINE: No polyuria or polydipsia.  HEMATOLOGIC: No easy bruising or bleeding.   SKIN: No rash or lesions.  MUSCULOSKELETAL: No joint pains and aches.  NEUROLOGIC: No  or numbness in any part of the body.   PHYSICAL EXAMINATION:  GENERAL: This is a well-built, well-nourished, age-appropriate male, lying down in the bed, not in distress.  VITAL SIGNS: Temperature 98.6, pulse 58, blood pressure , respiratory rate of 18, oxygen saturation is 97% on room air.  HEENT: Head normocephalic, atraumatic. There is no scleral icterus. Conjunctivae normal. Pupils equal and reactive to light. Extraocular movements are intact. Mucous membranes: Mild dryness. No pharyngeal erythema.  NECK: Supple. No lymphadenopathy. No JVD. No carotid bruit. No thyromegaly.  CHEST: Has no focal tenderness.  LUNGS: Bilaterally clear to auscultation.  HEART: S1, S2 regular. No murmurs are heard.  ABDOMEN: Bowel sounds present. Soft. Has tenderness in the left lower quadrant. No rebound or guarding. No hepatosplenomegaly.  EXTREMITIES: No pedal edema. Pulses 2+.  NEUROLOGIC: The patient is alert, oriented to place, person and time. Cranial nerves II through XII intact. Motor 5/5 in upper and lower extremities.  SKIN: No rash or lesions.  MUSCULOSKELETAL: Range of motion in all of the extremities.   LABORATORIES: UA negative for nitrites and leukocyte esterase. Lipase 97. CBC: WBC of 13.9, hemoglobin 17.9, platelet count of 304. CMP: BUN 22, creatinine of 1.19, potassium of 3.2. The rest of all of the values are within normal limits   ASSESSMENT AND PLAN: Mr. Dominic Foley is a 49 year old male who comes to the Emergency Department with nausea, vomiting, abdominal pain.  1. Diverticulitis: Keep the patient n.p.o. Continue with Cipro and Flagyl. Once the patient has improvement of the  nausea and vomiting, will start the patient on clear liquid diet and advance the diet as tolerated. Will need treatment for a total of 2 weeks with Cipro and Flagyl. The patient will need to have a colonoscopy as an outpatient 5 to 6  weeks after the treatment.  2. Hypertension: Poorly controlled. Will continue the hydralazine. Will also start the patient on amlodipine and lisinopril considering the patient's poorly controlled blood pressure.  3. Hypokalemia: Will replace by intravenous.  4. Tobacco use: Counseled with the patient.  5. Keep the patient on deep vein thrombosis prophylaxis with Lovenox.   TIME SPENT: 45 minutes.   ____________________________ Susa Griffins, MD pv:gb D: 08/18/2013 04:22:28 ET T: 08/18/2013 04:37:21 ET JOB#: [id number]  cc: Susa Griffins, MD, <Dictator> Susa Griffins MD ELECTRONICALLY SIGNED 08/19/2013 3:22

## 2015-02-08 NOTE — Discharge Summary (Signed)
PATIENT NAME:  [name], Earon R MR#:  [id number] DATE OF BIRTH:  Sep 26, 1966  DATE OF ADMISSION:  08/18/2013 DATE OF DISCHARGE:  08/19/2013  ADMISSION DIAGNOSIS:  Acute diverticulitis.   DISCHARGE DIAGNOSES: 1.  Acute diverticulitis.  2.  Chest pain. 3.  Sepsis on arrival.  4.  Accelerated hypertension.   CONSULTATIONS:  Dr. Juliann Paresallwood, cardiology.   IMAGING:  The patient had a CT scan performed on admission which showed evidence of diverticulitis.  Discharge white blood cells 13, hemoglobin 16.5, hematocrit 47.2, platelets 251, sodium 136, potassium 3.8, chloride 104, bicarb 30, BUN 15, creatinine 1.09, glucose 115. Troponin is 0.06. Troponin at discharge 0.04. 2-D echocardiogram showed an ejection fraction of 55% and 60% with normal global left ventricular systolic function. Blood cultures are negative today. LDL is 84.   HOSPITAL COURSE:  A 49 year old male who presented with nausea, vomiting and abdominal pain, found to have a diverticulitis on CT scan. For further details, please refer to the H and P.   1.  Sepsis on admission. The patient has leukocytosis, tachycardia secondary to diverticulitis. The patient was started on Flagyl and ciprofloxacin and his vitals, as well as leukocytosis has improved.  2.  Diverticulitis. The patient is on day 2 of antibiotics. His symptoms have improved. We have tried clear liquid diet. The patient will be on low residual diet at discharge. There is no abscess on the CT scan. The patient will need an outpatient followup with a colonoscopy in the future.  3.  Chest pain. The patient was having some chest pain during hospitalization. His troponin max was 0.06 and the other 2 were within normal limits. Cardiology was consulted. His echo was essentially normal. Telemetry showed no acute arrhythmia, which is likely secondary to demand ischemia from sepsis. He has a history of hypertension, smoking and noncompliance with medications. He will need outpatient  stress test.  4.  Tobacco abuse. The patient was counseled. The patient was counseled and the patient is reluctant to stop smoking.  5.  Accelerated hypertension. The patient has not been compliant with his medication. I have added hydralazine and metoprolol to his discharge medications.  The patient will need close followup.   DISCHARGE MEDICATIONS: 1.  Flagyl 500 mg q. 8 hours x 12 days.  2.  Ciprofloxacin 500 mg q. 12 hours x 12 days.  3.  Metoprolol 50 mg b.i.d.  4.  Nicotine patch 21 mg per 24 hours.  5.  Hydralazine 25 mg t.i.d.  6.  Aspirin 81 mg daily.   DISCHARGE DIET:  Low-residual diet.   DISCHARGE ACTIVITY:  As tolerated.  DISCHARGE FOLLOWUP:  [facility] week and in 1 to 2 weeks with Dr. Juliann Paresallwood.  The patient will need a functional study and close followup for her high blood pressure.  The patient is medically stable for discharge.   TIME SPENT:  Approximately 35 minutes.     cc:  Dr. Leandra Kernallwood  [facility]   ____________________________ Janyth ContesSital P. Juliene PinaMody, MD spm:ce D: 08/19/2013 12:22:08 ET T: 08/19/2013 14:47:07 ET JOB#: [id number]  cc: Dalene Robards P. Juliene PinaMody, MD, <Dictator> Janyth ContesSITAL P Vasilisa Vore MD ELECTRONICALLY SIGNED 08/19/2013 20:35

## 2015-02-08 NOTE — Consult Note (Signed)
PATIENT NAME:  Dominic Foley, Dominic Foley MR#:  [id number] DATE OF BIRTH:  02/22/1966  DATE OF CONSULTATION:  08/18/2013  REFERRING PHYSICIANS: Dr. Zenda AlpersWebster and Dr. Heron NayVasireddy   CONSULTING PHYSICIAN:  Chelsa Stout D. Lawonda Pretlow, MD  INDICATION: Chest pain, abnormal EKG.  HISTORY OF PRESENT ILLNESS:  Mr. Dominic Foley is a 49 year old African American male with past medical history of hypertension, smoking who has not been compliant with his medications. He came to the Emergency Room with complaints of nausea and vomiting for the last three days. He has been unable to keep anything down. He has had some mild abdominal pain. His last bowel movement was 2 days ago, dark in color. Workup in the ER, including a CT of the abdomen suggested inflammation in the sigmoid area, possibly diverticulitis. He also had diverticulosis. The patient has never had a colonoscopy. Denies any significant blood in the stool even though his stool was dark. Denies any significant weight loss. No real fever. He was found to have elevated blood pressure, systolic was close to 200, was treated with IV hydralazine in the Emergency Room. Now feels somewhat better but his EKG still shows nonspecific ST-T wave changes and he had some mild chest discomfort as well, in addition to the abdominal discomfort.   PAST MEDICAL HISTORY:  Hypertension and smoking.   PAST SURGICAL HISTORY:  Left hand tendon repair.   ALLERGIES:  None.  MEDICATIONS:  He presented to the hospital with unknown medications.   SOCIAL HISTORY: He smokes about a pack a day. No significant alcohol consumption. Denies drug use. Lives alone. Works as a Scientist, water qualitybrick mason.  FAMILY HISTORY:  Hypertension.  REVIEW OF SYSTEMS:  No blackout spells, syncope. He has had nausea and vomiting, mild constipation. No fever, chills, sweats. Denies weight loss or weight gain. No hemoptysis or hematemesis. Denies bright red blood per rectum. No vision change or hearing change. Denies sputum production or  cough.   PHYSICAL EXAMINATION: VITAL SIGNS:  Blood pressure was initially 200 on admission, now it is down to 170/80, pulse 65, respiratory rate 18, afebrile.  HEENT:  Normocephalic, atraumatic. Pupils equal, reactive to light.  NECK:  Supple. No significant JVD, bruits or adenopathy.  LUNGS:  Clear to auscultation and percussion. No significant wheeze, rhonchi or rale.  HEART:  Regular rate and rhythm.  ABDOMEN:  Positive bowel sound.  No rebound, guarding or tenderness.  EXTREMITIES:  Within normal limits.  NEUROLOGIC:  Intact.  SKIN:  Normal.   LABORATORY, DIAGNOSTIC AND RADIOLOGIC DATA: UA essentially negative. Lipase 97, white count of 13.9, hemoglobin 17.9, platelet count of 304. BUN 22, creatinine 1.19, potassium 3.2; otherwise negative.  CT of the abdomen as mentioned with possible inflammation in the sigmoid with diverticulosis as well.   IMPRESSION: 1.  _____ . 2.  Diverticulitis. 3.  Gastroenteritis.  4.  Hypertension.  5.  Hypokalemia.  6.  Abnormal EKG. 7.  Tobacco abuse. 8.  Noncompliance.   PLAN: Continue hydration. There is some concern he dehydrated from the nausea, vomiting and decreased p.o. intake. Replace electrolytes and correct potassium. Also broad-spectrum antibiotics for possible diverticulitis. Would treat chest pain with short-acting anticoagulants. Consider low-dose aspirin. Consider GERD medications like Protonix IV. Would advise the patient to quit smoking. Consider echocardiogram. Would also consider whether the patient requires a colonoscopy at some point. Hopefully, his diverticulitis is improved. We will follow up white count. Since it is elevated, hopefully, it will come back down; and treat the nausea. If the patient continues to improve,  we will treat him conservatively and do further GI work-up at a later date. Again, cardiac workup should be limited to conservative measures at this point unless chest pain continues or he has worsened EKG changes. I  suspect his EKG will improve once his electrolytes are corrected. He may still have an underlying abnormal EKG because of hypertension but, again, putting patient back on adequate hypertensive medications should be adequate, I suspect for now. Functional study can be done at a later date.    ____________________________ Bobbie Stack. Juliann Pares, MD ddc:ce D: 08/18/2013 18:29:26 ET T: 08/18/2013 18:49:34 ET JOB#: [id number]  cc: Alyx Mcguirk D. Juliann Pares, MD, <Dictator> Alwyn Pea MD ELECTRONICALLY SIGNED 09/19/2013 21:15

## 2015-02-09 NOTE — Discharge Summary (Signed)
PATIENT NAME:  Dominic Foley, Dominic Foley MR#:  [id number] DATE OF BIRTH:  1966-01-02  DATE OF ADMISSION:  11/04/2013 DATE OF DISCHARGE:  11/08/2013  DISCHARGE DIAGNOSES: 1.  Acute diverticulitis which is recurrent.  2.  Acute pancreatitis of unknown etiology.  3.  Hypertension.   CONSULTANTS: Dr. Juliann Foley of surgery.   IMAGING STUDIES DONE: Include a CT scan of the abdomen and pelvis with contrast that showed recurrent diverticulitis within same portion of the sigmoid colon. No evidence of free air or abscess. Stable renal cysts.   Ultrasound of the right upper quadrant showed normal right upper quadrant ultrasound. Specifically, no stones in the gallbladder or bile ducts.   ADMITTING HISTORY AND PHYSICAL AND HOSPITAL COURSE: Please see detailed H and P dictated previously. In brief, a 49 year old male patient with prior history of sigmoid colon diverticulitis who presented to the hospital with complaints of abdominal pain and was found to have recurrent diverticulitis, admitted to the hospitalist service. The patient was started on IV antibiotics. Surgery was consulted who suggested the patient get an elective partial colectomy secondary to recurrent diverticulitis for which he will be [facility].   The patient did have some worsening pain after eating. His lipase level was checked, which was high, initially thought to be secondary to irritation from the diverticulitis, but this trended up to 3000. Ultrasound was done which showed no gallstones. The patient does not drink any alcohol. His pancreatitis is of unknown etiology. The patient's triglycerides were checked recently and were at 87. Lipase by the day of discharge is 241.   Today on examination, the patient has mild tenderness in the left upper quadrant, noted to be guarding. Bowel sounds are present. His lipase has trended down to 241. He has tolerated a regular diet. Has been [facility] and being discharged home.   DISCHARGE MEDICATIONS: Include:  1.  Metoprolol tartrate 50 mg oral every 12 hours.  2.  Hydralazine 50 mg oral 3 times a day.  3.  Aspirin 81 mg daily.  4.  Metronidazole 500 mg 3 times a day for 9 days.  5.  Ciprofloxacin 500 mg oral 2 times a day for 9 days.  6.  Tramadol 50 mg oral 3 times a day as needed for pain.   DISCHARGE INSTRUCTIONS: Low-sodium, low-fat diet. Activity as tolerated. [facility] in 1 to 2 weeks.  TIME SPENT: On day of discharge in discharge activity was 40 minutes.  ____________________________ Dominic Foley. Dominic Swigert, MD srs:sb D: 11/09/2013 13:40:10 ET T: 11/09/2013 14:20:46 ET JOB#: [id number]  cc: Dominic HeathSrikar Foley. Dominic Schneeberger, MD, <Dictator> Dominic Foley Dominic Zuver MD ELECTRONICALLY SIGNED 11/14/2013 10:16

## 2015-02-09 NOTE — H&P (Signed)
PATIENT NAME:  Dominic Foley, Dominic Foley MR#:  [id number] DATE OF BIRTH:  1966/07/18  DATE OF ADMISSION:  02/01/2014  PRIMARY CARE PHYSICIAN: [facility].  CHIEF COMPLAINT: Elevated blood pressure and abdominal pain.   HISTORY OF PRESENT ILLNESS: A 49 year old African American male patient with history of recurrent diverticulitis and gallbladder abnormalities, who was here in outpatient procedures area for a cholecystectomy and also sigmoid colon surgery with Dr. Juliann PulseLundquist, but the patient was noticed to have blood pressure elevated at 235/140 and has received 2 doses of labetalol totaling 40 mg IV and still blood pressure is elevated at 195/130 diastolic and the hospitalist team has been consulted.   The patient mentions that he has been compliant with his medications. He still using medication from his previous discharge and has not seen his primary care physician. He does not watch the amount of salt in his diet. He mentioned that he did marijuana 1-1/2 weeks prior, but nothing since. No cocaine or heroin.   The patient was seen by Dr. Juliann PulseLundquist yesterday as outpatient and his blood pressure at that time was 193/123 and this was thought to be secondary to his abdominal pain.   Today, the patient has received a dose of Demerol for his pain. He mentions that this pain is under control, but the blood pressure is still significantly elevated. The patient is being admitted on labetalol drip.   PAST MEDICAL HISTORY:  1.  Recurrent diverticulitis.  2.  Hypertension.  3.  Pancreatitis.   SOCIAL HISTORY: The patient smokes a pack a day. Does not drink any alcohol. Occasionally uses marijuana.   CODE STATUS: Full code.  FAMILY HISTORY: Hypertension.   ALLERGIES: No known drug allergies.   REVIEW OF SYSTEMS: CONSTITUTIONAL: No fever, fatigue or weakness. EYES: No blurred vision, pain or redness. ENT: No tinnitus, ear pain, hearing loss. RESPIRATORY: No cough, wheeze, hemoptysis. CARDIOVASCULAR:  No chest pain, orthopnea, edema. GASTROINTESTINAL: Has chronic abdominal pain in the lower abdomen and the epigastric area. No nausea, vomiting, diarrhea. GENITOURINARY: No dysuria, hematuria, frequency. ENDOCRINE: No polyuria, nocturia, thyroid problems. HEMATOLOGIC AND LYMPHATIC: No anemia, easy bruising, bleeding. INTEGUMENTARY: No acne, rash, lesion. MUSCULOSKELETAL: No back pain, arthritis.  NEUROLOGIC: No focal numbness, weakness, seizure. PSYCHIATRIC: No anxiety or depression.   HOME MEDICATIONS:  1.  Aspirin 81 mg daily.  2.  Hydralazine 50 mg oral 3 times a day.  3.  Metoprolol tartrate 50 mg oral twice a day.  4.  Percocet 5/325, 1 tablet every 4 hours as needed.  5.  Zofran ODT 4 mg oral every 4 hours as needed for nausea or vomiting.   PHYSICAL EXAMINATION:  VITAL SIGNS: Heart rate of 80, blood pressure 235/130, saturating 96%.  GENERAL: A moderately built, African American male patient lying in bed, seems comfortable, conversational, cooperative with exam.  PSYCHIATRIC: Alert and oriented x3. Mood and affect appropriate. Judgment intact.  HEENT: Atraumatic, normocephalic. Oral mucosa dry and pink. No ulcers or thrush. External ears and nose normal. Pupils symmetric and reactive to light.  NECK: Supple. No thyromegaly. No palpable lymph nodes. Trachea midline. No carotid bruit or JVD.  CARDIOVASCULAR: S1 and S2 without any murmurs. Peripheral pulses 2+. No edema.  RESPIRATORY: Normal work of breathing. Clear to auscultation and percussion. GI: Soft abdomen with tenderness diffusely, more in the left lower quadrant area. No rigidity or guarding. Bowel sounds present.  GENITOURINARY: No CVA tenderness, bladder distention.  SKIN: Warm and dry. No petechiae.  MUSCULOSKELETAL: No joint swelling, redness,  effusion of the large joints. Normal muscle tone.  NEUROLOGICAL: Motor strength 5 out of 5 in upper and lower extremities. Sensation is intact bilaterally.  LYMPHATICS. No cervical  lymphadenopathy.  Studies from 415 show glucose 122, BUN 10, creatinine 1.1, sodium 140, potassium 3.8. AST, A  LABORATORY STUDIES: From 01/31/2014, showed glucose 122, BUN 10, creatinine 1.1. Sodium 140, potassium 3.8. AST, ALT, alkaline phosphatase, bilirubin normal. Urine drug screen from yesterday was negative. WBC 7.1, hemoglobin 16.7, platelets 304.   ASSESSMENT AND PLAN:  1.  Accelerated hypertension with blood pressure still elevated at 195/130 in spite of 2 doses of IV labetalol. Will place him on labetalol drip, admit to step down at this time. We will restart his hydralazine and metoprolol. We will also add lisinopril to his medication list. We will target blood pressure no less than 170/90 to control the blood pressure slowly. Hopefully, the patient can be transferred to the floor once his blood pressure is improved. The patient does not have any signs of encephalopathy, change in vision or chest pain.  2.  Need for cholecystectomy and sigmoid surgery. The patient will be followed by surgery. His surgery is not planned at this time. This will be scheduled later as outpatient. Discussed with Dr. Juliann Pulse of surgery. We will consult him.  3.  Tobacco abuse. Counseled the patient to quit smoking for greater than 3 minutes.  4.  Deep vein thrombosis prophylaxis with Lovenox.   CODE STATUS: Full code.   TIME SPENT: 45 minutes.  ____________________________ [name] Vineet Kinney, MD srs:aw D: 02/01/2014 13:23:50 ET T: 02/01/2014 13:35:49 ET JOB#: [id number]  cc: [name]. Katherin Ramey, MD, <Dictator> Christopher A. Juliann Pulse, MD [facility] Orie Fisherman MD ELECTRONICALLY SIGNED 02/02/2014 12:51

## 2015-02-09 NOTE — Discharge Summary (Signed)
PATIENT NAME:  Dominic Foley, [name] MR#:  [id number] DATE OF BIRTH:  11-21-1965  DATE OF ADMISSION:  02/01/2014 DATE OF DISCHARGE:  02/02/2014  ADMITTING DIAGNOSIS:  Malignant hypertension.   DISCHARGE DIAGNOSES:  1.  Malignant hypertension.  2.  Nausea, vomiting, resolved.  3.  Tobacco, marijuana abuse.   4.  History of recurrent diverticulitis as well as  pancreatitis,  abdominal pain due to above.   DISCHARGE CONDITION:  Stable.   DISCHARGE MEDICATIONS:  The patient is to resume his outpatient medications which are aspirin 81 mg by mouth daily, Percocet 5/325 mg 1 tablet every six hours as needed, metoprolol tartrate 50 mg by mouth twice daily, Zofran ODT 4 mg every 4 to 6 hours as needed, docusate sodium 100 mg twice daily as needed, pantoprazole 40 mg by mouth daily and lisinopril 40 mg by mouth daily.  The patient is not to take hydralazine.   HOME OXYGEN:  None.   DIET:  2 gram salt, low fat, low-cholesterol, mechanical soft.  Advance to regular as tolerated.    FOLLOWUP APPOINTMENT:  [facility] two days after discharge as well as Dr. Juliann Foley in two days after discharge.   CONSULTANTS:  Dr. Juliann Foley.   RADIOLOGIC STUDIES:  None.   HOSPITAL COURSE:  The patient is a 49 year old male with past medical history significant for history of abdominal pain who presents to the hospital for scheduled gallbladder operation as well as sigmoid colon operation for diverticulitis who was noted to have elevated blood pressure at 235/140 and despite labetalol his blood pressure did not improve.  For this reason he was admitted to the hospital for treatment of his hypertension and operation was postponed.  Physical exam was unremarkable.  The patient's lab data on arrival to the hospital revealed normal BMP, except elevation of glucose to 122, otherwise BMP was unremarkable.  Lipase level was normal at 127.  Liver enzymes showed mild elevation of total bilirubin to 1.1 and direct bilirubin  was 0.5.  Urine drug screen was negative.  CBC was within normal limits.  The patient was admitted to the hospital.  His blood pressure medications were advanced.  With this, his condition significantly improved.  His blood pressure normalized and this was discussed with Dr. Juliann Foley, however due to time Dr. Juliann Foley was not able to take the patient to the operating room for cholecystectomy and sigmoid colon operation today on 02/02/2014, so it was felt that the patient would be best served to be discharged home.  Dr. Juliann Foley is to call the patient back to his office and discuss further operation time.  On the day of discharge 02/02/2014 the patient felt satisfactory, did not complain of any significant discomfort and his vitals were stable.  His temperature was 98.4, pulse was 56 to 60, respiratory rate was 16, blood pressure 121 to 141 systolic and diastolic was 70s to 90s, O2 sats were 99% on room air at rest.   TIME SPENT:  40 minutes.     ____________________________ Dominic Caperima Shon Mansouri, MD rv:ea D: 02/02/2014 19:40:34 ET T: 02/03/2014 03:35:59 ET JOB#: [id number]  cc: Dominic Caperima Maeven Mcdougall, MD, <Dictator> Phineas Realharles [facility] Dominic A. Juliann PulseLundquist, MD Dominic CaperIMA Adyen Bifulco MD ELECTRONICALLY SIGNED 02/17/2014 18:32

## 2015-02-09 NOTE — Discharge Summary (Signed)
PATIENT NAME:  Dominic Foley, Dominic Foley#:  [id number] DATE OF BIRTH:  August 08, 1966  DATE OF ADMISSION:  02/12/2014 DATE OF DISCHARGE:  02/20/2014  PREOPERATIVE DIAGNOSES:  1.  Recurrent diverticulitis.  2.  Recurrent pancreatitis, likely due to gallbladder sludge.  3.  History of hypertension, often refractory.   PROCEDURE PERFORMED: Laparoscopic cholecystectomy and laparoscopic-assisted sigmoid colectomy, mobilization of splenic flexure on February 12, 2014.   DISCHARGE MEDICATIONS: As follows: Aspirin 81 p.o. daily, metoprolol 50 mg p.o. q.12 hours, lisinopril 40 mg p.o. daily, Norco 1 to 2 tabs p.o. q.6 hours p.r.n. pain, clonidine 0.2 p.o. b.i.d., hydralazine 100 mg p.o. q.8 hours.   INDICATION FOR ADMISSION: Dominic Foley is a pleasant 49 year old male with recurrent abdominal pain and CT scan findings consistent with diverticulitis. He was also noted to have recurrent pancreatitis in the past with septic work-up showing only sludge in the gallbladder.   HOSPITAL COURSE AS FOLLOWS: Dominic Foley underwent the above-mentioned surgeries on April 27. Throughout the remainder of hospital course, he slowly regained bowel function and was converted from IV to p.o. pain medicine. At the time of discharge, he was taking good p.o. pain control, voiding and stooling without difficulty. He also had times of intermittent hypertension, which was controlled throughout hospital course with consultant assistance.   DISCHARGE INSTRUCTIONS: Dominic Foley is to call or return to the ED if has increased pain, nausea, vomiting, redness or drainage from incision.  ____________________________ Si Raiderhristopher A. Darinda [place], MD cal:aw D: 02/27/2014 06:50:44 ET T: 02/27/2014 08:23:01 ET JOB#: [id number]  cc: Cristal Deerhristopher A. Lakysha Kossman, MD, <Dictator> Jarvis NewcomerHRISTOPHER A Laksh Hinners MD ELECTRONICALLY SIGNED 03/01/2014 4:11

## 2015-02-09 NOTE — Consult Note (Signed)
PATIENT NAME:  YOVANNI, FRENETTE MR#:  [id number] DATE OF BIRTH:  05/15/1966  DATE OF CONSULTATION:  11/05/2013  REFERRING PHYSICIAN:   CONSULTING PHYSICIAN:  Cristal Deer A. Jet Armbrust, MD  REASON FOR CONSULTATION: Left lower quadrant pain, history of diverticulitis, mild diverticulitis on CT scan.   HISTORY OF PRESENT ILLNESS:  Mr. Bialy is a pleasant 49 year old African American gentleman with history of hypertension as well as diverticulitis which initially occurred in October but it appeared not to be completely resolved and, thus, again was admitted in November for diverticulitis, who presents with 1 week of intermittent, gradually worsening left lower quadrant pain. He says that it began approximately a week ago and has gotten worse, seems similar to his diverticulitis in the past. He had some nausea and vomiting approximately 5 days ago which resolved around the time of his colonoscopy that he had for evaluation, routine post-diverticulitis colonoscopy, and also some emesis x 2 days. He says that he feels like the food is getting stuck down there when he eats.  Also has some subjective fevers and chills in the Emergency Room, says that it does feel similar to when he was admitted.  No headache, chest pain, shortness of breath, cough. Current nausea, vomiting, current diarrhea, constipation. No dysuria or hematuria.   PAST MEDICAL HISTORY: 1.  Hypertension.  2.  Recurrent diverticulitis.   HOME MEDICATIONS:  Aspirin 81 daily, hydralazine 50 mg t.i.d., metoprolol 50 mg b.i.d.   SOCIAL HISTORY: Smokes approximately 3/4 pack of cigarettes per day. Denies alcohol or drug use.  Says no drinking in the last two years.   FAMILY HISTORY: Positive for hypertension and cancer in his grandmother.   ALLERGIES: No known drug allergies.   REVIEW OF SYSTEMS: A 12-point review of systems  was obtained. Pertinent positives and negatives as above.   PHYSICAL EXAMINATION: VITAL SIGNS: Currently  temperature 98.5, pulse 80, blood pressure 148/96, respirations 20, 96% on room air.  GENERAL: No acute distress, alert and oriented x 3.   HEAD: Normocephalic, atraumatic.  EYES: No scleral icterus. No conjunctivitis.  FACE: No obvious facial trauma. Normal external nose. Normal external ears.  CHEST: Lungs clear to auscultation, moving air well.  HEART: Regular rate and rhythm without murmurs, rubs or gallops.  ABDOMEN: Soft, minimally tender left lower quadrant, nondistended.  EXTREMITIES: Moves all extremities well. Strength 5 out of 5. NEUROLOGIC: Cranial nerves II through XII grossly intact. Sensation intact in all 4 extremities.   LABORATORY DATA: Significant for white cell count of 9.2 yesterday. Creatinine is 1.14, lipase 6 and the amylase is 153   IMAGING: Shows some mild stranding around sigmoid colon, no free air or abscess.   ASSESSMENT AND PLAN: Mr. Bowen is a pleasant 49 year old who presents with what appears to be a second or third dose of recurrent diverticulitis in a short period of time. He is on IV antibiotics; however, due to the fact that he has not resolved previously on Cipro and Flagyl we will consider changing to Zosyn. He is not extremely toxic right now and I do not feel there is any need for current or emergent surgery and indication for emergent surgery would be worsening pain or nonresolution of pain. Typically, due to recurrent episodes, would recommend sigmoid colectomy; however, would like for him to cool down prior to this and would wait 6 weeks. Since he has already had colonoscopy, may be able to perform sooner but not during this admission. Unsure why lipase and amylase is elevated, however,  has no epigastric pain, no need for ultrasound at this time unless this does not resolve. This may be reactive due to diverticulitis in some way. Will follow up on pathology from prior colonoscopy. Okay with clears but if the patient's pain continues would back up to  n.p.o. to attempt nonoperative management of diverticulitis. We will continue to follow.   ____________________________ Si Raiderhristopher A. Sigurd Pugh, MD cal:cs D: 11/05/2013 13:02:06 ET T: 11/05/2013 15:53:32 ET JOB#: [id number]  cc: Cristal Deerhristopher A. Lilah Mijangos, MD, <Dictator> Jarvis NewcomerHRISTOPHER A Letcher Schweikert MD ELECTRONICALLY SIGNED 11/08/2013 13:53

## 2015-02-09 NOTE — H&P (Signed)
History of Present Illness 48 yobm previously admitted with acute sigmoid diverticulitits and hyperlipasemia who was D/C-ed 1 month ago and finished PO ABx 3 weeks ago who developed recurrent pain, nausea, and vomiting. No fever.   Past Med/Surgical Hx:  Diverticulitis:   HTN:   Left hand second digit tendon repair:   ALLERGIES:  No Known Allergies:   HOME MEDICATIONS: Medication Instructions Status  hydrALAZINE 50 mg oral tablet 1 tab(s) orally 3 times a day Active  metoprolol tartrate 50 mg oral tablet 1 tab(s) orally every 12 hours Active  aspirin 81 mg oral delayed release tablet 1 tab(s) orally once a day Active   Family and Social History:  Family History Non-Contributory   Social History positive  tobacco, negative ETOH   + Tobacco Current (within 1 year)   Place of Living Home  [place] mostly out of work, married with kids at home, smokes 3/4 PPD, no ETOH   Review of Systems:  Fever/Chills No   Cough No   Sputum No   Abdominal Pain Yes   Diarrhea No   Constipation No   Nausea/Vomiting Yes   SOB/DOE No   Chest Pain No   Dysuria No   Tolerating PT Yes   Tolerating Diet No  Nauseated  Vomiting   Physical Exam:  GEN well developed, well nourished, no acute distress   HEENT pink conjunctivae, PERRL, hearing intact to voice, moist oral mucosa, Oropharynx clear   NECK No masses  trachea midline   RESP normal resp effort  clear BS  no use of accessory muscles   CARD regular rate  no murmur  no JVD  no Rub   ABD positive tenderness   LYMPH negative neck   EXTR negative cyanosis/clubbing, negative edema   SKIN normal to palpation, No ulcers, skin turgor good   NEURO cranial nerves intact, follows commands, motor/sensory function intact   PSYCH alert, A+O to time, place, person   Lab Results: Hepatic:  19-Feb-15 23:11   Bilirubin, Total  1.8  Alkaline Phosphatase 78 (45-117 NOTE: New Reference Range 09/08/13)  SGPT (ALT) 51  SGOT  (AST)  51  Total Protein, Serum  8.8  Albumin, Serum 4.2  Routine Chem:  19-Feb-15 23:11   Glucose, Serum  138  BUN  20  Creatinine (comp) 1.11  Sodium, Serum  135  Potassium, Serum  3.2  Chloride, Serum  97  CO2, Serum 31  Calcium (Total), Serum 9.7  Osmolality (calc) 275  eGFR (African American) >60  eGFR (Non-African American) >60 (eGFR values <51m/min/1.73 m2 may be an indication of chronic kidney disease (CKD). Calculated eGFR is useful in patients with stable renal function. The eGFR calculation will not be reliable in acutely ill patients when serum creatinine is changing rapidly. It is not useful in  patients on dialysis. The eGFR calculation may not be applicable to patients at the low and high extremes of body sizes, pregnant women, and vegetarians.)  Anion Gap 7  Lipase  422 (Result(s) reported on 07 Dec 2013 at 11:46PM.)  Routine UA:  20-Feb-15 01:37   Color (UA) Amber  Clarity (UA) Clear  Glucose (UA) Negative  Bilirubin (UA) Negative  Ketones (UA) 1+  Specific Gravity (UA) 1.028  Blood (UA) 1+  pH (UA) 5.0  Protein (UA) 100 mg/dL  Nitrite (UA) Negative  Leukocyte Esterase (UA) Negative (Result(s) reported on 08 Dec 2013 at 02:43AM.)  RBC (UA) 11 /HPF  WBC (UA) 2 /HPF  Bacteria (UA) NONE  SEEN  Epithelial Cells (UA) 1 /HPF  Mucous (UA) PRESENT (Result(s) reported on 08 Dec 2013 at 02:43AM.)  Routine Hem:  19-Feb-15 23:11   WBC (CBC) 9.8  RBC (CBC) 5.49  Hemoglobin (CBC) 17.7  Hematocrit (CBC) 50.3  Platelet Count (CBC) 346  MCV 92  MCH 32.3  MCHC 35.2  RDW 12.9  Neutrophil % 58.2  Lymphocyte % 28.0  Monocyte % 12.2  Eosinophil % 0.8  Basophil % 0.8  Neutrophil # 5.7  Lymphocyte # 2.7  Monocyte #  1.2  Eosinophil # 0.1  Basophil # 0.1 (Result(s) reported on 07 Dec 2013 at 11:45PM.)   Radiology Results: LabUnknown:    20-Feb-15 05:10, CT Abdomen and Pelvis With Contrast  PACS Image  CT:  CT Abdomen and Pelvis With Contrast  REASON FOR  EXAM:    (1) llq pain vomiting; (2) llq pain vomiting  COMMENTS:       PROCEDURE: CT  - CT ABDOMEN / PELVIS  W  - Dec 08 2013  5:10AM     CLINICAL DATA:  Left upper quadrant pain radiating to back, history  of diverticulitis with similar feeling. Patient scheduled for  surgery December 26, 2013.    EXAM:  CT ABDOMEN AND PELVIS WITH CONTRAST    TECHNIQUE:  Multidetector CT imaging of the abdomen and pelvis was performed  using the standard protocol following bolus administration of  intravenous contrast.    CONTRAST:  125 cc of Isovue 370    COMPARISON:  US ABDOMEN LIMITED RUQ/ASCITES dated 11/08/2013; CT  ABD-PELV W/ CM dated 11/04/2013    FINDINGS:  Included view of the lung bases remain clear. The visualized heart  and pericardium areunremarkable.    Stomach and small bowel are normal in course and caliber, contrast  is yet to reach the distal small bowel. Normal appendix. Proximal  colon is unremarkable and unchanged. Similar sigmoid diverticulitis  and same distribution. No bowel perforation, no free fluid or or  fluid collections.    The liver, spleen, pancreas, gallbladder and adrenal glands are  unremarkable and unchanged.    Bilateral renal cysts again seen, largest measuring 2.3 cm in left  interpolar kidney. The kidneys are otherwise unremarkable. Delayed  imaging demonstrates prompt symmetric excretion of contrast into the  proximal urinary collecting system. Aortoiliac vessels are normal in  course and caliber, unremarkable. Urinary bladder is partially  distended, normal. Prostate is 4.5 cm in transaxial dimensions.    Ankylosis of the sacroiliac joints. Moderate L5-S1 degenerative disc  disease with resultant severe left neural foraminal narrowing.   IMPRESSION:  Recurrent/ refractory diverticulitis of thesigmoid colon without  bowel perforation, abscess or bowel obstruction.      Electronically Signed    By: Elon Alas    On: 12/08/2013 05:19          Verified By: Ricky Ala, M.D.,    Assessment/Admission Diagnosis recurrent / persistent acute sigmoid diverticulitis, uncomplicated hyperlipasemia - chemical pancreatitis without CT changes?   Plan Admit, IVF, IV ABx, pain and N/V meds Will check amylase and follow Sigmoid colectomy as planned   Electronic Signatures: Consuela Mimes (MD)  (Signed 20-Feb-15 07:10)  Authored: CHIEF COMPLAINT and HISTORY, PAST MEDICAL/SURGIAL HISTORY, ALLERGIES, HOME MEDICATIONS, FAMILY AND SOCIAL HISTORY, REVIEW OF SYSTEMS, PHYSICAL EXAM, LABS, Radiology, ASSESSMENT AND PLAN   Last Updated: 20-Feb-15 07:10 by Consuela Mimes (MD)

## 2015-02-09 NOTE — Discharge Summary (Signed)
PATIENT NAME:  Dominic HalstedWILLIAMS, Jossue R MR#:  [id number] DATE OF BIRTH:  01-12-1966  DATE OF ADMISSION:  12/08/2013 DATE OF DISCHARGE:  12/10/2013  PRINCIPLE DIAGNOSIS: Acute sigmoid diverticulitis and hyperlipasemia.   OTHER DIAGNOSES: Hypertension; left hand 2nd-digit tendon repair.   HOSPITAL COURSE: The patient was admitted to the hospital and placed on IV antibiotics and he improved and was discharged home 2 days later.   He was given a follow-up appointment with Dr. Ida Roguehristopher Lundquist.   ____________________________ Claude MangesWilliam F. Hayslee Casebolt, MD wfm:np D: 12/25/2013 16:44:28 ET T: 12/25/2013 17:47:05 ET JOB#: [id number]  cc: Claude MangesWilliam F. Afton Mikelson, MD, <Dictator> Claude MangesWILLIAM F Geniva Lohnes MD ELECTRONICALLY SIGNED 12/26/2013 8:16

## 2015-02-09 NOTE — Consult Note (Signed)
PATIENT NAME:  [name], Ichiro R MR#:  [id number] DATE OF BIRTH:  September 08, 1966  DATE OF CONSULTATION:  02/12/2014  REFERRING PHYSICIAN:  Dr. Juliann PulseLundquist of surgery. CONSULTING PHYSICIAN:  Yaniyah Koors R. Donda Friedli, MD  REASON FOR CONSULTATION: Hypertension.   HISTORY OF PRESENT ILLNESS: A 49 year old African American male patient with history of hypertension, recurrent diverticulitis and gallstone pancreatitis presents to the hospital today for surgery of cholecystectomy and also sigmoidectomy. The patient is presently in PACU postop, a little drowsy, but still complains of pain in his abdomen. Blood pressure has been elevated into the 200 systolic, and hospitalist service has been consulted. Presently, the patient has received a dose of hydralazine and labetalol. Blood pressure is 188/111. The patient does not have any nausea, vomiting, shortness of breath; only complains of abdominal pain, although he is still drowsy from his anesthesia. He just received a dose of Dilaudid.   The patient has been compliant with his medications. He was recently in the hospital 1 week prior for uncontrolled hypertension when his surgery had to be canceled. He was discharged home on lisinopril, metoprolol. On discussing with Dr. Juliann PulseLundquist, he mentions that the patient was added on his hydralazine during one of the followup appointments, which was discontinued at discharge. In spite of this, patient's blood pressure was elevated at 170 prior to surgery.   PAST MEDICAL HISTORY: 1.  Recurrent diverticulitis.  2.  Hypertension.  3.  Pancreatitis.  4.  Tobacco abuse.   SOCIAL HISTORY: The patient smokes a pack a day. No alcohol. Occasionally uses marijuana.   CODE STATUS:  FULL CODE.  FAMILY HISTORY: Hypertension.   ALLERGIES: No known drug allergies.   REVIEW OF SYSTEMS: Unobtainable secondary to the patient's drowsiness from anesthesia.   HOME MEDICATIONS: 1.  Aspirin 81 mg daily.  2.  Metoprolol tartrate 50 mg twice  daily.  3.  Lisinopril 40 mg daily.  4.  Percocet 5/325, 1 tablet every 4 hours as needed.  5.  Zofran ODT 4 mg oral every 4 hours as needed for nausea and vomiting.   PHYSICAL EXAMINATION: VITAL SIGNS:  Shows temperature 98, saturating 100% on oxygen. Blood pressure of 188/111, pulse 82.  GENERAL: Obese African American male patient lying in bed, drowsy, but wakes up with pain at times.  PSYCHIATRIC: Drowsy. Unable judge mood and affect.  HEENT: Atraumatic, normocephalic. Oral mucosa dry and pink. External ears normal. No pallor. Pupils bilaterally equal and reactive to light.  NECK: Supple. No thyromegaly or palpable lymph nodes.  Trachea is midline. No carotid bruit or JVD.  CARDIOVASCULAR: S1, S2, without any murmurs. Peripheral pulses 2+. No edema.  RESPIRATORY: Normal work of breathing. Clear to auscultation on both sides.  GASTROINTESTINAL: Has dressing over his scars. Rigid. Bowel sounds absent.  GENITOURINARY: No CVA tenderness or bladder distention.  SKIN: Warm and dry. No petechiae, rash or ulcers.  MUSCULOSKELETAL: No joint swelling or redness of any of the large joints. Normal muscle tone.  NEUROLOGICAL: Moves all 4 extremities equally.  LYMPHATICS:  No cervical lymphadenopathy.   LABORATORY STUDIES: From recent admission have been reviewed along with prior imaging studies.   ASSESSMENT AND PLAN: 1.  Accelerated hypertension in a patient who has had blood pressure issues recently in the hospital, but was fairly controlled at discharge. Presently, we will continue him on lisinopril, metoprolol and hydralazine, which seems to have controlled his blood pressure well. We will give him a dose of labetalol and hydralazine at this time for bringing the blood pressure  down. We will have to increase his hydralazine dose if his blood pressure is still uncontrolled on these pills. His blood pressure is likely elevated also from the pain; will be monitored. Discussed with Dr. Juliann Pulse.  2.   Cholecystectomy and sigmoidectomy. The patient has been started on some sips of liquid. Can give him oral pills. Pain controlled with Dilaudid PCA.  3.  Deep vein thrombosis prophylaxis with heparin.   CODE STATUS: FULL CODE.   Time spent today on this consult was 45 minutes.    ____________________________ [name] Raygan Skarda, MD srs:dmm D: 02/12/2014 17:56:00 ET T: 02/12/2014 21:45:59 ET JOB#: [id number]  cc: Wardell Heath R. Hadyn Blanck, MD, <Dictator>  Orie Fisherman MD ELECTRONICALLY SIGNED 02/13/2014 17:23

## 2015-02-09 NOTE — H&P (Signed)
PATIENT NAME:  Dominic Foley, Dominic Foley MR#:  [id number] DATE OF BIRTH:  November 15, 1965  DATE OF ADMISSION:  11/04/2013  REFERRING PHYSICIAN:  Dr. Lovena Le.   PRIMARY CARE PHYSICIAN:  None.  Previously seen at [facility], however not returned there.   CHIEF COMPLAINT:  Abdominal pain.   HISTORY OF PRESENT ILLNESS:  A 49 year old African American gentleman with past medical history of hypertension as well as recurrent diverticulitis is presenting with one week in duration intermittent, though gradually worsening left lower quadrant without radiation.  He describes this as dull, 8 out of 10 in intensity, worsening after vomiting episode and occasional movements.  No relieving factors.  Associated nausea, vomiting, nonbloody, nonbilious emesis x 2.  Denies any diarrhea or constipation, bowel changes, fevers or chills.  Currently complaining only of mild intensity abdominal pain, though drastically improved since his time in the Emergency Department.   REVIEW OF SYSTEMS:  CONSTITUTIONAL:  Denies fever, fatigue, weakness, pain.  EYES:  Denies blurred vision, double vision, eye pain.  EARS, NOSE, THROAT:  Denies tinnitus, ear pain, hearing loss.  RESPIRATORY:  Denies cough, wheeze, shortness of breath. CARDIOVASCULAR:  Denies chest pain, palpitations, edema.  GASTROINTESTINAL:  Positive for nausea, vomiting, abdominal pain as described above, however denies any constipation or diarrhea.   GENITOURINARY:  Denies dysuria, hematuria.  ENDOCRINE:  Denies nocturia or thyroid problems.  HEMATOLOGY AND LYMPHATIC:  Denies easy bruising or bleeding.  SKIN:  Denies rash or lesions.  MUSCULOSKELETAL:  Denies pain in neck, back, shoulder, knees, hips, arthritic symptoms.  NEUROLOGIC:  Denies paralysis, paresthesias. PSYCHIATRIC:  Denies anxiety or depressive symptoms.  Otherwise, full review of systems performed by me is negative.   PAST MEDICAL HISTORY:  Hypertension as well as recurrent diverticulitis.  This  is his third bout as far.   SOCIAL HISTORY:  Positive for tobacco use.  Denies any alcohol or drug usage.   FAMILY HISTORY:  Positive for hypertension.   ALLERGIES:  No known drug allergies.   HOME MEDICATIONS:  Include aspirin 81 mg by mouth daily, hydralazine 50 mg by mouth 3 times daily, metoprolol 50 mg by mouth twice daily.   PHYSICAL EXAMINATION: VITAL SIGNS:  Temperature 98.7, heart rate 93, respirations 20, blood pressure 195/115, saturating 99% on room air.  Weight 90.7 kg, BUN 27.2.  GENERAL:  Well-nourished, well-developed, African American gentleman, currently in no acute distress.  HEAD:  Normocephalic, atraumatic.  EYES:  Pupils equal, round, reactive to light.  Extraocular muscles intact.  No scleral icterus.  MOUTH:  Moist mucous membranes.  Dentition intact.   EARS, NOSE, THROAT:  Throat clear without exudate.  No external lesions.  NECK:  Supple.  No thyromegaly.  No nodules.  No JVD.  PULMONARY:  Clear to auscultation bilaterally.  No wheezes, rubs or rhonchi.  No use of accessory muscles.  Good respiratory effort.  CHEST:  Nontender to palpation.  CARDIOVASCULAR:  S1, S2, regular rate and rhythm.  No murmurs, rubs or gallops.  No edema.  Pedal pulses 2+ bilaterally. GASTROINTESTINAL:  Soft, mild tenderness to palpation of left lower quadrant without rebound or guarding.  No motion tenderness, nondistended.  No masses.  Positive bowel sounds.  No hepatosplenomegaly.  MUSCULOSKELETAL:  No swelling, clubbing, edema.  Range of motion full in all extremities.  NEUROLOGIC:  Cranial nerves II through XII intact.  No gross focal neurological deficits.  Sensation intact.  Reflexes intact.  SKIN:  No ulcerations, lesions, rashes, cyanosis.  Skin warm, dry.  Turgor  is intact. PSYCHIATRIC:  Mood and affect within normal limits.  The patient is awake, alert, oriented x 3.  Insight and judgment intact.   LABORATORY DATA:  Sodium 135, potassium 3.6, chloride 100, bicarb 33, BUN 15,  creatinine 1.15, glucose 120, amylase 153, lipase 678.  LFTs:  Total protein 9.3, albumin 4.5, bilirubin 1.4, alk phos 87, AST 38, ALT 57.  WBC 9.2, hemoglobin 17.7, platelets of 323.  Urinalysis negative for evidence of infection.  Abdominal and pelvic CAT scan performed which revealed recurrent diverticulitis in the same portion of the sigmoid colon.  No free air or abscess.  Renal cyst.  Pancreas is within normal limits as well as biliary tract.   ASSESSMENT AND PLAN:  A 49 year old African American gentleman presenting with recurrent diverticulitis.  This is the third bout. 1.  Mild diverticulitis.  Cipro, Flagyl for antibiotics.  Gastroenterology consult given this is a recurrent nature.  IV fluids, pain medications as required.  Place the patient nothing by mouth and may require surgery in the future, though no acute indication at this time.  2.  Mildly elevated pancreatic enzymes, likely reactive in nature as pancreas on CT is within normal limits.  Regardless, provide IV fluid hydration as well as pain medications as required.  3.  Hyponatremia.  IV fluid hydration with normal saline.  Follow sodium level.  4.  Hypertension.  Continue home doses of metoprolol as well as hydralazine.  5.  VTE prophylaxis with sequential compression devices.  6.  CODE STATUS:  THE PATIENT IS FULL CODE.   TIME SPENT:  45 minutes.    ____________________________ Aaron Mose. Fredda Clarida, MD dkh:ea D: 11/04/2013 22:35:54 ET T: 11/04/2013 23:23:28 ET JOB#: [id number]  cc: Aaron Mose. Katlynn Naser, MD, <Dictator> Ramzy Cappelletti Woodfin Ganja MD ELECTRONICALLY SIGNED 11/06/2013 1:10

## 2015-02-09 NOTE — Op Note (Signed)
PATIENT NAME:  Dominic Foley, Dominic Foley MR#:  [id number] DATE OF BIRTH:  [birth year]  DATE OF PROCEDURE:  02/12/2014  ATTENDING SURGEON: Dr. Salome Holmes.   ASSISTANT: Dr. Westley Gambles.  PREOPERATIVE DIAGNOSES:  1. Recurrent pancreatitis and gallbladder sludge,  2. History of recurrent sigmoid diverticulitis.   POSTOPERATIVE DIAGNOSES:  1. History of pancreatitis with gallbladder sludge.  2. Sigmoid diverticulitis with a large mass and proximal stricture.   ANESTHESIA: General.   ESTIMATED BLOOD LOSS: 250 mL.   COMPLICATIONS: None.   SPECIMEN: Gallbladder, sigmoid mass proximal sigmoid colon containing stricture.   ANESTHESIA: General endotracheal.   INDICATION FOR SURGERY: Dominic Foley is a pleasant 49 year old male who presents with recurrent diverticulitis as well as recurrent pancreatitis and negative work-up for pancreatitis except for gallbladder sludge, who was brought to the Operating Room suite for a cholecystectomy and sigmoid colectomy.   PROCEDURE PERFORMED: 1. Laparoscopic cholecystectomy.  2. Laparoscopic assisted sigmoid colectomy.  3. Open take down of splenic flexure.     DETAILS OF PROCEDURE: Informed consent was obtained. [name] was brought to the Operating Room suite. He was laid supine on the Operating Room table. He was induced. Endotracheal tube was placed, general anesthesia was administered. His abdomen was then prepped and draped in standard surgical fashion. A timeout was then performed correctly identify patient name, operative site and procedure to be performed. A supraumbilical incision was made. This was deepened down to the fascia. The fascia was incised.  The  peritoneum was entered. Two stay sutures were placed through the fasciotomy. The abdomen was insufflated. Hassan trocar was placed in the abdomen and the abdomen was insufflated. An 11 mm epigastric and  two 5 mm subcostal trocars were then placed. The gallbladder was then lifted up over the dome  of the liver. The cystic artery and cystic duct were dissected out and a critical view was obtained. Clip was placed over these structures and they were ligated. The gallbladder was then taken off the gallbladder fossa. It was partially intrahepatic.  Hemostasis had to be obtained with cautery and Surgicel.   I proceeded to place a suprapubic trocar. I then looked in the pelvis and saw large inflammatory mass consistent with previous episodes of diverticulitis. The  left colon was mobilized incising the line of Toldt. Laparoscopically there was significant play and splenic flexure was not initially mobilized.  After I was happy with mobilization the abdomen was opened with an inferior midline incision. The specimen was then brought in to the surface and the colon was ligated on both sides with an Endo GIA. The colonic mesentery was then taken down with a combination of suture ligatures and Harmonic scalpel. We then placed a pursestring stitch in the distal colon and 28 mm anvil was placed there. A 28 mm stapler was then placed through a colotomy upstream; however, it hit some significant resistance and could not be advanced. The dilator could not be placed all the way at the end. It appeared to be that there was a palpable stricture and therefore this additional piece of colon was excised and in order to make the left colon reach to the remaining sigmoid colon/rectum the splenic flexure had to be mobilized. A larger incision was made to accomplish this. After the flexure was mobilized it reached with no difficulty. The colotomy was made again in the proximal segment. A 29 mm stapler was then placed into the staple line. The spike was opened then brought through the staple line. It was  attached to the anvil. The 2 pieces were brought together by closing the stapler. The stapler was fired. The stapler was then removed. Both donuts were examined and noted to be in continuity. The colotomy was closed with a TA stapler  transversely. Abdomen was then irrigated. It was noted to be hemostatic. The drapes were then changed and the midline fascia was closed with a 2 loop PDS, ran from both sides and tied in the middle.  All wounds were then stapled. A dressing was then placed over the wounds. The patient was then awoken, extubated and brought to the postanesthesia care unit. There were no immediate complications. Needle, sponge, and instrument counts were correct at the end of the procedure.    ____________________________ Si Raiderhristopher A. Ceylon Arenson, MD cal:sg D: 02/13/2014 09:00:18 ET T: 02/13/2014 11:13:00 ET JOB#: [id number]  cc: Cristal Deerhristopher A. Jerimey Burridge, MD, <Dictator> Jarvis NewcomerHRISTOPHER A Monish Haliburton MD ELECTRONICALLY SIGNED 02/17/2014 8:38

## 2016-02-07 IMAGING — CT CT ABD-PELV W/ CM
2 of 5 series · 17 of 46 positions shown, 19 images · IV contrast (isovue)
Comparison: US ABDOMEN LIMITED RUQ/ASCITES dated 11/08/2013; CT
ABD-PELV W/ CM dated 11/04/2013

CLINICAL DATA: Left upper quadrant pain radiating to back, history
of diverticulitis with similar feeling. Patient scheduled for
surgery December 26, 2013.

EXAM:
CT ABDOMEN AND PELVIS WITH CONTRAST
TECHNIQUE: Multidetector CT imaging of the abdomen and pelvis was performed
using the standard protocol following bolus administration of
intravenous contrast.
CONTRAST:  125 cc of Isovue 370

[Series 2: routine abd pel with · axial · 0.73mm/px · z∈[-1018,-594]mm · 14 of 97 slices shown, 16 images]
[im 6/97  soft-tissue]
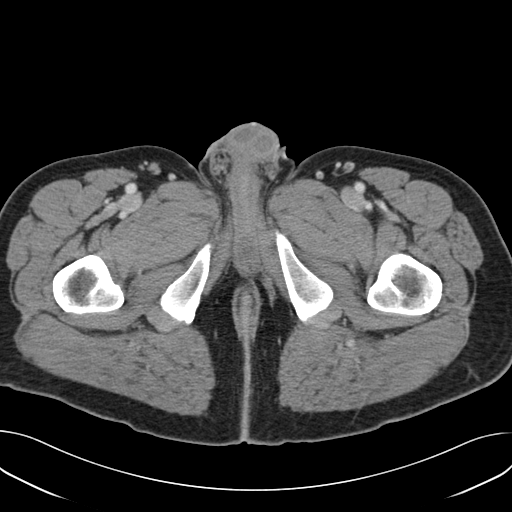
[im 6/97  bone]
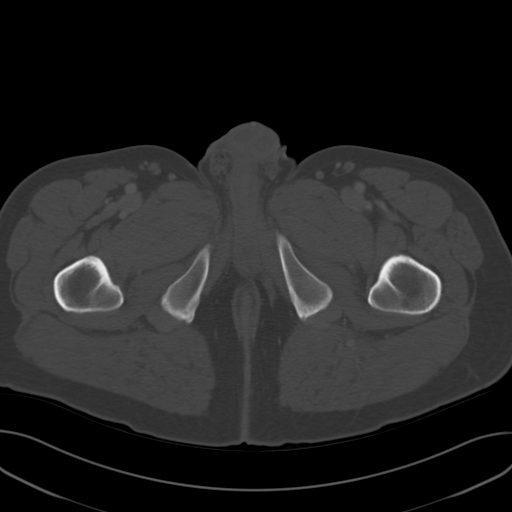
[im 11/97  soft-tissue]
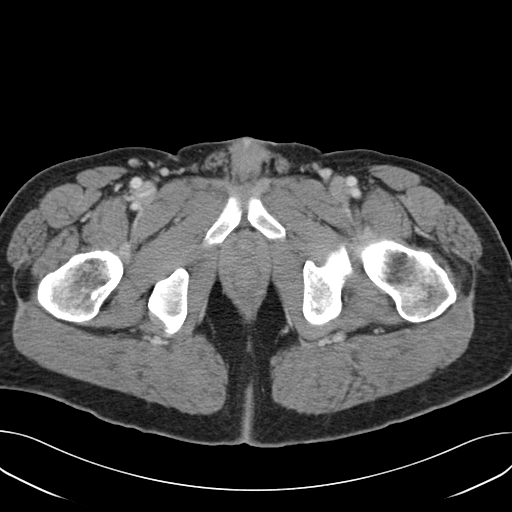
[im 21/97  soft-tissue]
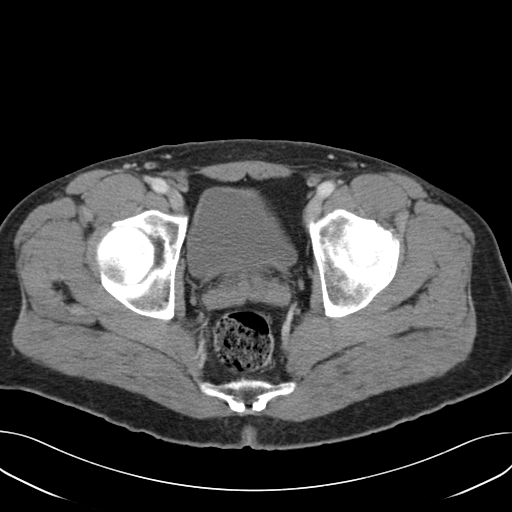
[im 26/97  soft-tissue]
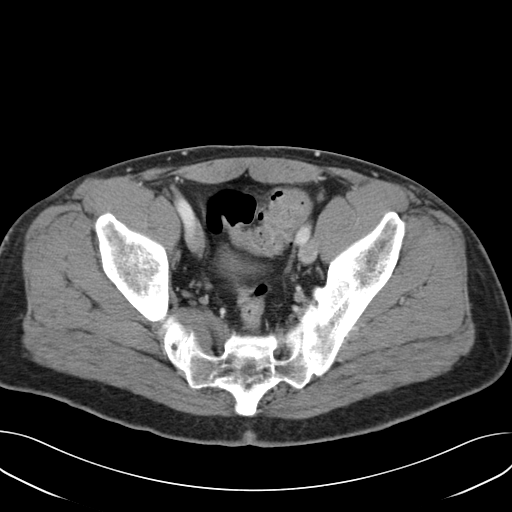
[im 31/97  soft-tissue]
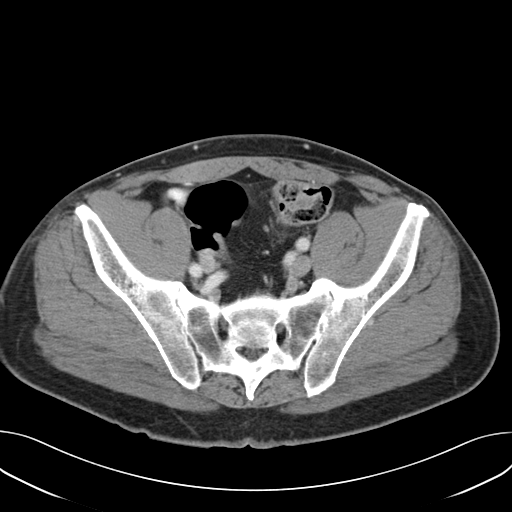
[im 41/97  soft-tissue]
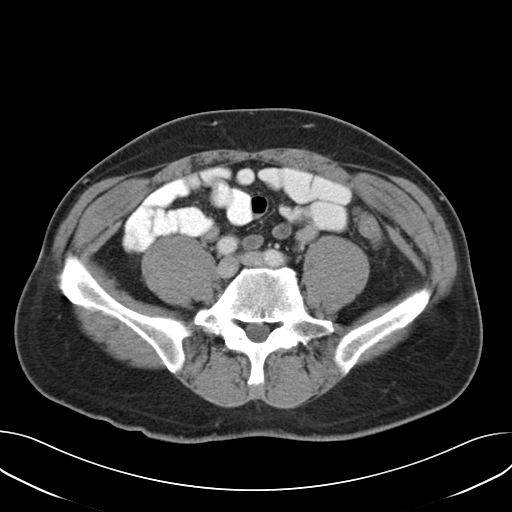
[im 46/97  soft-tissue]
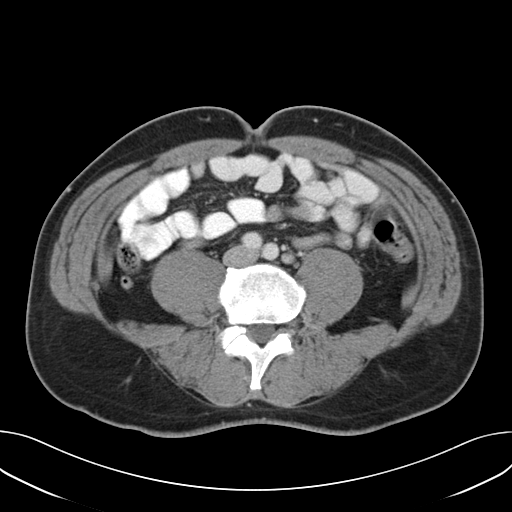
[im 51/97  soft-tissue]
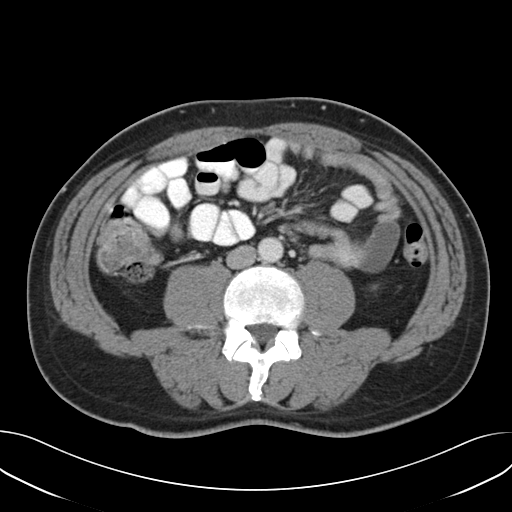
[im 56/97  soft-tissue]
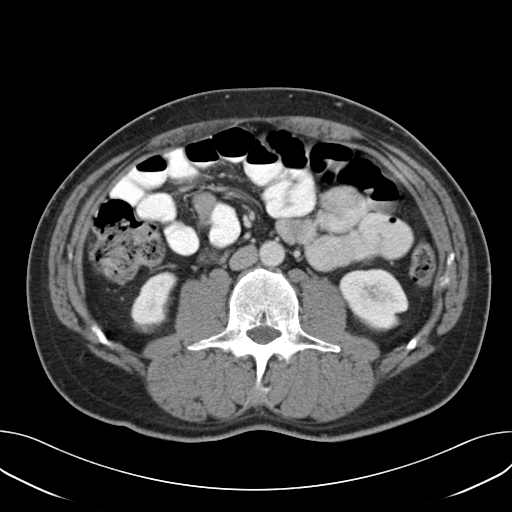
[im 56/97  bone]
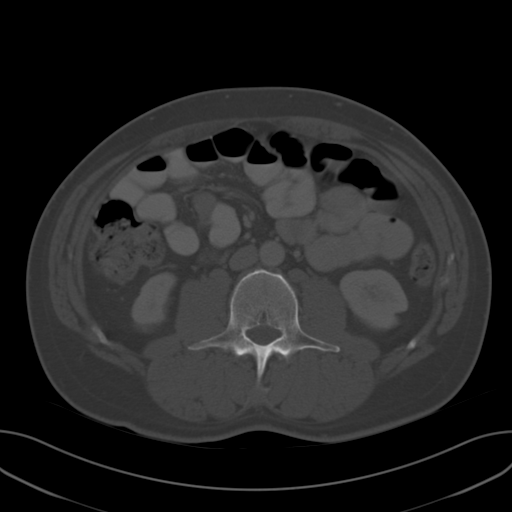
[im 66/97  soft-tissue]
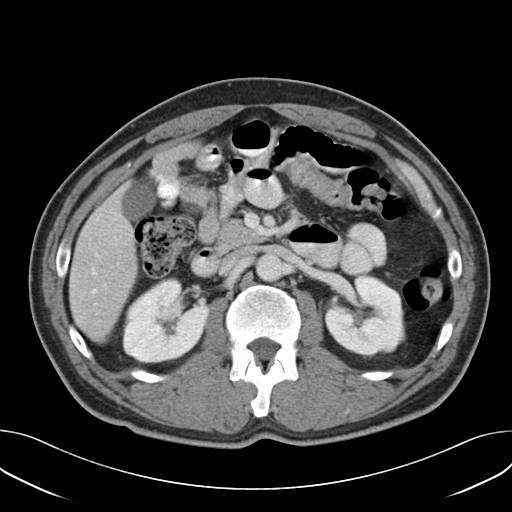
[im 71/97  soft-tissue]
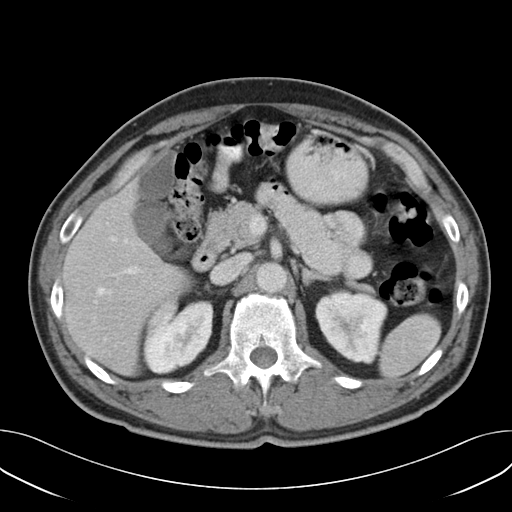
[im 76/97  soft-tissue]
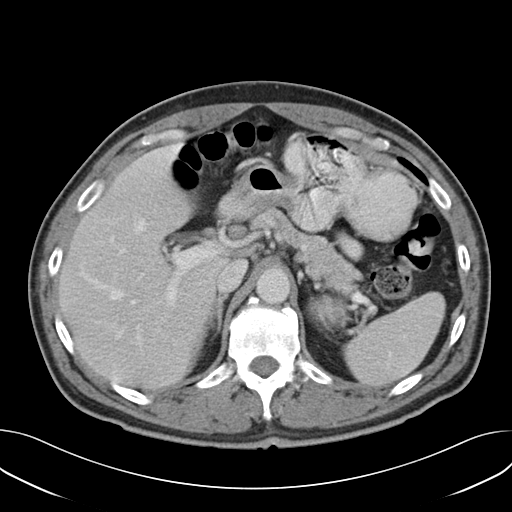
[im 86/97  soft-tissue]
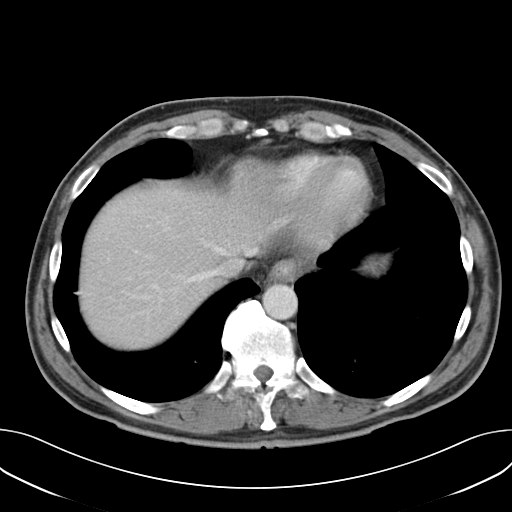
[im 91/97  soft-tissue]
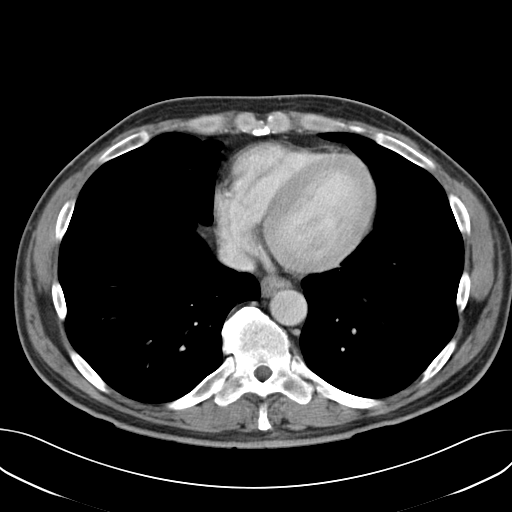

[Series 6: cor routine abd pel with · coronal · 0.92mm/px · 3 of 134 slices shown]
[im 45/134  soft-tissue]
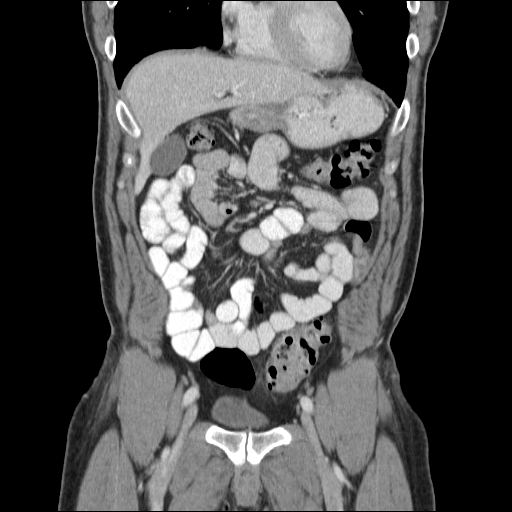
[im 60/134  soft-tissue]
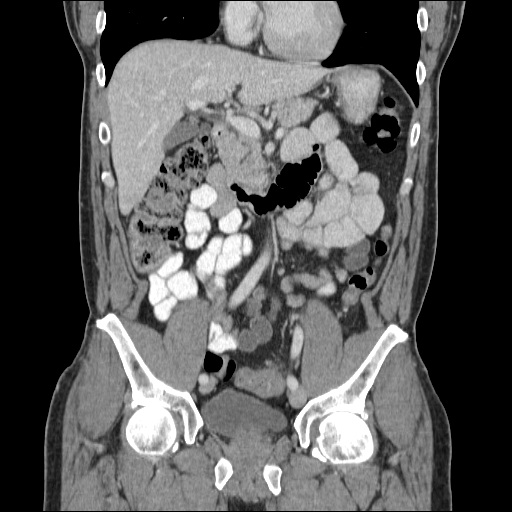
[im 74/134  soft-tissue]
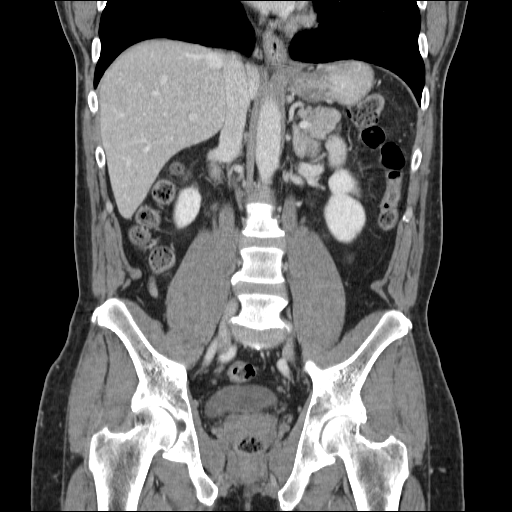

[17 of 46 positions shown; findings below may reference images not displayed]

FINDINGS: Included view of the lung bases remain clear. The visualized heart
and pericardium are unremarkable.

Stomach and small bowel are normal in course and caliber, contrast
is yet to reach the distal small bowel. Normal appendix. Proximal
colon is unremarkable and unchanged. Similar sigmoid diverticulitis
and same distribution. No bowel perforation, no free fluid or or
fluid collections.

The liver, spleen, pancreas, gallbladder and adrenal glands are
unremarkable and unchanged.

Bilateral renal cysts again seen, largest measuring 2.3 cm in left
interpolar kidney. The kidneys are otherwise unremarkable. Delayed
imaging demonstrates prompt symmetric excretion of contrast into the
proximal urinary collecting system. Aortoiliac vessels are normal in
course and caliber, unremarkable. Urinary bladder is partially
distended, normal. Prostate is 4.5 cm in transaxial dimensions.

Ankylosis of the sacroiliac joints. Moderate L5-S1 degenerative disc
disease with resultant severe left neural foraminal narrowing.
IMPRESSION: Recurrent/ refractory diverticulitis of the sigmoid colon without
bowel perforation, abscess or bowel obstruction.

  By: Abimelk Tiger

## 2016-05-07 ENCOUNTER — Emergency Department
Admission: EM | Admit: 2016-05-07 | Discharge: 2016-05-07 | Disposition: A | Discharge status: Home / Self Care | Payer: Medicaid Other | Attending: Emergency Medicine | Admitting: Emergency Medicine

## 2016-05-07 ENCOUNTER — Encounter: Payer: Self-pay | Admitting: Emergency Medicine

## 2016-05-07 DIAGNOSIS — I10 Essential (primary) hypertension: Secondary | ICD-10-CM | POA: Insufficient documentation

## 2016-05-07 DIAGNOSIS — Z202 Contact with and (suspected) exposure to infections with a predominantly sexual mode of transmission: Secondary | ICD-10-CM | POA: Insufficient documentation

## 2016-05-07 DIAGNOSIS — F17210 Nicotine dependence, cigarettes, uncomplicated: Secondary | ICD-10-CM | POA: Insufficient documentation

## 2016-05-07 HISTORY — DX: Essential (primary) hypertension: I10

## 2016-05-07 LAB — CHLAMYDIA/NGC RT PCR (ARMC ONLY)
CHLAMYDIA TR: NOT DETECTED
N GONORRHOEAE: NOT DETECTED

## 2016-05-07 MED ORDER — CEFTRIAXONE SODIUM 250 MG IJ SOLR
250.0000 mg | Freq: Once | INTRAMUSCULAR | Status: AC
  Administered 2016-05-07: 250 mg via INTRAMUSCULAR
  Filled 2016-05-07: qty 250

## 2016-05-07 MED ORDER — AZITHROMYCIN 500 MG PO TABS
1000.0000 mg | ORAL_TABLET | Freq: Once | ORAL | Status: AC
  Administered 2016-05-07: 1000 mg via ORAL
  Filled 2016-05-07: qty 2

## 2016-05-07 MED ORDER — LIDOCAINE HCL (PF) 1 % IJ SOLN
INTRAMUSCULAR | Status: AC
  Filled 2016-05-07: qty 5

## 2016-05-07 NOTE — ED Notes (Signed)
See triage note   States he is not having any sx's  But [facility]

## 2016-05-07 NOTE — ED Provider Notes (Signed)
[facility] Emergency Department Provider Note ____________________________________________  Time seen: 1507  I have reviewed the triage vital signs and the nursing notes.  HISTORY  Chief Complaint  Exposure to STD  HPI Dominic Foley is a 50 y.o. male presents to the ED for treatment following exposure to gonorrhea chlamydia. Patient describes his girlfriend is currently being treated for gonorrhea. Patient denies any current symptoms including dysuria, discharge, or pelvic pain.  Past Medical History  Diagnosis Date  . Hypertension     Has not taken BP Meds for about two years.    There are no active problems to display for this patient.   History reviewed. No pertinent past surgical history.  No current outpatient prescriptions on file.  Allergies Review of patient's allergies indicates no known allergies.  No family history on file.  Social History Social History  Substance Use Topics  . Smoking status: Current Every Day Smoker -- 0.50 packs/day    Types: Cigarettes  . Smokeless tobacco: Never Used  . Alcohol Use: No   Review of Systems  Constitutional: Negative for fever. Gastrointestinal: Negative for abdominal pain, vomiting and diarrhea. Genitourinary: Negative for dysuria. Denies discharge Musculoskeletal: Negative for back pain. Skin: Negative for rash. Neurological: Negative for headaches, focal weakness or numbness. ____________________________________________  PHYSICAL EXAM:  VITAL SIGNS: ED Triage Vitals  Enc Vitals Group     BP 05/07/16 1429 143/99 mmHg     Pulse Rate 05/07/16 1426 84     Resp 05/07/16 1426 18     Temp 05/07/16 1426 98.3 F (36.8 C)     Temp Source 05/07/16 1426 Oral     SpO2 05/07/16 1426 97 %     Weight 05/07/16 1426 250 lb (113.399 kg)     Height 05/07/16 1426 6' (1.829 m)     Head Cir --      Peak Flow --      Pain Score 05/07/16 1428 0     Pain Loc --      Pain Edu? --      Excl. in  GC? --    Constitutional: Alert and oriented. Well appearing and in no distress. Head: Normocephalic and atraumatic. Respiratory: Normal respiratory effort. No wheezes/rales/rhonchi. GU: Deferred Musculoskeletal: Nontender with normal range of motion in all extremities.  Neurologic:  Normal gait without ataxia. Normal speech and language. No gross focal neurologic deficits are appreciated. Skin:  Skin is warm, dry and intact. No rash noted. Psychiatric: Mood and affect are normal. Patient exhibits appropriate insight and judgment. ____________________________________________   LABS (pertinent positives/negatives) Labs Reviewed  CHLAMYDIA/NGC RT PCR ([facility] ONLY)  ____________________________________________  PROCEDURES  Rocephin 250 mg IM Azithromycin 1 g PO ____________________________________________  INITIAL IMPRESSION / ASSESSMENT AND PLAN / ED COURSE  Patient with post exposure prophylaxis for GC. He should follow-up with his primary care provider or [facility] for further evaluation management. He is advised to abstain from any sexual contact until both he and his partner had completely been treated and her both free of symptoms. ____________________________________________  FINAL CLINICAL IMPRESSION(S) / ED DIAGNOSES  Final diagnoses:  Exposure to STD     Lissa HoardJenise V Bacon [name], PA-C 05/07/16 1549  Jeanmarie PlantJames A McShane, MD 05/07/16 [phone]

## 2016-05-07 NOTE — Discharge Instructions (Signed)
You have been treated for gonorrhea and chlamydia. You and your partner should avoid sexual contact for at least a week, AND until both have completed any treatment, AND both are symptom-free. [facility] for further testing and treatment.   Sexually Transmitted Disease A sexually transmitted disease (STD) is a disease or infection that may be passed (transmitted) from person to person, usually during sexual activity. This may happen by way of saliva, semen, blood, vaginal mucus, or urine. Common STDs include:  Gonorrhea.  Chlamydia.  Syphilis.  HIV and AIDS.  Genital herpes.  Hepatitis B and C.  Trichomonas.  Human papillomavirus (HPV).  Pubic lice.  Scabies.  Mites.  Bacterial vaginosis. WHAT ARE CAUSES OF STDs? An STD may be caused by bacteria, a virus, or parasites. STDs are often transmitted during sexual activity if one person is infected. However, they may also be transmitted through nonsexual means. STDs may be transmitted after:   Sexual intercourse with an infected person.  Sharing sex toys with an infected person.  Sharing needles with an infected person or using unclean piercing or tattoo needles.  Having intimate contact with the genitals, mouth, or rectal areas of an infected person.  Exposure to infected fluids during birth. WHAT ARE THE SIGNS AND SYMPTOMS OF STDs? Different STDs have different symptoms. Some people may not have any symptoms. If symptoms are present, they may include:  Painful or bloody urination.  Pain in the pelvis, abdomen, vagina, anus, throat, or eyes.  A skin rash, itching, or irritation.  Growths, ulcerations, blisters, or sores in the genital and anal areas.  Abnormal vaginal discharge with or without bad odor.  Penile discharge in men.  Fever.  Pain or bleeding during sexual intercourse.  Swollen glands in the groin area.  Yellow skin and eyes (jaundice). This is  seen with hepatitis.  Swollen testicles.  Infertility.  Sores and blisters in the mouth. HOW ARE STDs DIAGNOSED? To make a diagnosis, your health care provider may:  Take a medical history.  Perform a physical exam.  Take a sample of any discharge to examine.  Swab the throat, cervix, opening to the penis, rectum, or vagina for testing.  Test a sample of your first morning urine.  Perform blood tests.  Perform a Pap test, if this applies.  Perform a colposcopy.  Perform a laparoscopy. HOW ARE STDs TREATED? Treatment depends on the STD. Some STDs may be treated but not cured.  Chlamydia, gonorrhea, trichomonas, and syphilis can be cured with antibiotic medicine.  Genital herpes, hepatitis, and HIV can be treated, but not cured, with prescribed medicines. The medicines lessen symptoms.  Genital warts from HPV can be treated with medicine or by freezing, burning (electrocautery), or surgery. Warts may come back.  HPV cannot be cured with medicine or surgery. However, abnormal areas may be removed from the cervix, vagina, or vulva.  If your diagnosis is confirmed, your recent sexual partners need treatment. This is true even if they are symptom-free or have a negative culture or evaluation. They should not have sex until their health care providers say it is okay.  Your health care provider may test you for infection again 3 months after treatment. HOW CAN I REDUCE MY RISK OF GETTING AN STD? Take these steps to reduce your risk of getting an STD:  Use latex condoms, dental dams, and water-soluble lubricants during sexual activity. Do not use petroleum jelly or oils.  Avoid having multiple sex  partners.  Do not have sex with someone who has other sex partners  Do not have sex with anyone you do not know or who is at high risk for an STD.  Avoid risky sex practices that can break your skin.  Do not have sex if you have open sores on your mouth or skin.  Avoid  drinking too much alcohol or taking illegal drugs. Alcohol and drugs can affect your judgment and put you in a vulnerable position.  Avoid engaging in oral and anal sex acts.  Get vaccinated for HPV and hepatitis. If you have not received these vaccines in the past, talk to your health care provider about whether one or both might be right for you.  If you are at risk of being infected with HIV, it is recommended that you take a prescription medicine daily to prevent HIV infection. This is called pre-exposure prophylaxis (PrEP). You are considered at risk if:  You are a man who has sex with other men (MSM).  You are a heterosexual man or woman and are sexually active with more than one partner.  You take drugs by injection.  You are sexually active with a partner who has HIV.  Talk with your health care provider about whether you are at high risk of being infected with HIV. If you choose to begin PrEP, you should first be tested for HIV. You should then be tested every 3 months for as long as you are taking PrEP. WHAT SHOULD I DO IF I THINK I HAVE AN STD?  See your health care provider.  Tell your sexual partner(s). They should be tested and treated for any STDs.  Do not have sex until your health care provider says it is okay. WHEN SHOULD I GET IMMEDIATE MEDICAL CARE? Contact your health care provider right away if:   You have severe abdominal pain.  You are a man and notice swelling or pain in your testicles.  You are a woman and notice swelling or pain in your vagina.   This information is not intended to replace advice given to you by your health care provider. Make sure you discuss any questions you have with your health care provider.   Document Released: 12/26/2002 Document Revised: 10/26/2014 Document Reviewed: 04/25/2013 Elsevier Interactive Patient Education [employer]

## 2016-05-07 NOTE — ED Notes (Signed)
Patient here with girlfriend who was treated for gonorrhea.  Patient here for treatment for STD.

## 2017-02-02 ENCOUNTER — Emergency Department: Payer: Self-pay

## 2017-02-02 ENCOUNTER — Emergency Department
Admission: EM | Admit: 2017-02-02 | Discharge: 2017-02-02 | Disposition: A | Discharge status: Home / Self Care | Payer: Self-pay | Attending: Emergency Medicine | Admitting: Emergency Medicine

## 2017-02-02 ENCOUNTER — Encounter: Payer: Self-pay | Admitting: *Deleted

## 2017-02-02 DIAGNOSIS — Y990 Civilian activity done for income or pay: Secondary | ICD-10-CM | POA: Insufficient documentation

## 2017-02-02 DIAGNOSIS — X500XXA Overexertion from strenuous movement or load, initial encounter: Secondary | ICD-10-CM | POA: Insufficient documentation

## 2017-02-02 DIAGNOSIS — Y9389 Activity, other specified: Secondary | ICD-10-CM | POA: Insufficient documentation

## 2017-02-02 DIAGNOSIS — Y929 Unspecified place or not applicable: Secondary | ICD-10-CM | POA: Insufficient documentation

## 2017-02-02 DIAGNOSIS — S39012A Strain of muscle, fascia and tendon of lower back, initial encounter: Secondary | ICD-10-CM | POA: Insufficient documentation

## 2017-02-02 DIAGNOSIS — F17210 Nicotine dependence, cigarettes, uncomplicated: Secondary | ICD-10-CM | POA: Insufficient documentation

## 2017-02-02 DIAGNOSIS — M62830 Muscle spasm of back: Secondary | ICD-10-CM

## 2017-02-02 DIAGNOSIS — I10 Essential (primary) hypertension: Secondary | ICD-10-CM | POA: Insufficient documentation

## 2017-02-02 LAB — URINALYSIS, COMPLETE (UACMP) WITH MICROSCOPIC
BILIRUBIN URINE: NEGATIVE
Bacteria, UA: NONE SEEN
Glucose, UA: NEGATIVE mg/dL
Hgb urine dipstick: NEGATIVE
KETONES UR: NEGATIVE mg/dL
LEUKOCYTES UA: NEGATIVE
NITRITE: NEGATIVE
PH: 6 (ref 5.0–8.0)
Protein, ur: NEGATIVE mg/dL
Specific Gravity, Urine: 1.023 (ref 1.005–1.030)
Squamous Epithelial / LPF: NONE SEEN

## 2017-02-02 MED ORDER — DEXAMETHASONE SODIUM PHOSPHATE 10 MG/ML IJ SOLN
10.0000 mg | Freq: Once | INTRAMUSCULAR | Status: AC
Start: 2017-02-02 — End: 2017-02-02
  Administered 2017-02-02: 10 mg via INTRAMUSCULAR
  Filled 2017-02-02: qty 1

## 2017-02-02 MED ORDER — KETOROLAC TROMETHAMINE 60 MG/2ML IM SOLN
30.0000 mg | Freq: Once | INTRAMUSCULAR | Status: AC
Start: 2017-02-02 — End: 2017-02-02
  Administered 2017-02-02: 30 mg via INTRAMUSCULAR
  Filled 2017-02-02: qty 2

## 2017-02-02 MED ORDER — CYCLOBENZAPRINE HCL 5 MG PO TABS: 5.0000 mg | ORAL_TABLET | Freq: Three times a day (TID) | ORAL | 0 refills | Status: DC | PRN

## 2017-02-02 MED ORDER — NAPROXEN 500 MG PO TABS: 500.0000 mg | ORAL_TABLET | Freq: Two times a day (BID) | ORAL | 0 refills | Status: DC

## 2017-02-02 NOTE — ED Provider Notes (Signed)
[facility]-EMERGENCY DEPARTMENT Provider Note   CSN: [id number] Arrival date & time: 02/02/17  1905     History   Chief Complaint Chief Complaint  Patient presents with  . Back Pain    HPI Dominic Foley is a 51 y.o. male presents to the emergency department for evaluation of left lower back pain. Patient has had back pain that has been present since last week. Denies any trauma or injury but has been performing a lot more lifting and twisting at work due to his helper being let go. Patient lays bricks for living. He is been lifting a lot more weight recently. He denies any trauma or injury. He describes pain along the left lower back that is described as tightness with radiation to left buttocks. No loss of bowel or bladder since. No numbness or tingling. No abdominal pain and not taking any medications to help alleviate the pain. No chest pain or shortness of breath.   Back Pain   Pertinent negatives include no chest pain, no fever, no headaches, no abdominal pain and no dysuria.  Positive for back pain.   Past Medical History:  Diagnosis Date  . Hypertension    Has not taken BP Meds for about two years.    There are no active problems to display for this patient.   History reviewed. No pertinent surgical history.     Home Medications    Prior to Admission medications   Medication Sig Start Date End Date Taking? Authorizing Provider  cyclobenzaprine (FLEXERIL) 5 MG tablet Take 1-2 tablets (5-10 mg total) by mouth 3 (three) times daily as needed for muscle spasms. 02/02/17   Evon Slack, PA-C  naproxen (NAPROSYN) 500 MG tablet Take 1 tablet (500 mg total) by mouth 2 (two) times daily with a meal. 02/02/17   Evon Slack, PA-C    Family History No family history on file.  Social History Social History  Substance Use Topics  . Smoking status: Current Every Day Smoker    Packs/day: 0.50    Types: Cigarettes  . Smokeless tobacco: Never Used  . Alcohol use No      Allergies   Patient has no known allergies.   Review of Systems Review of Systems  Constitutional: Negative.  Negative for activity change, appetite change, chills and fever.  HENT: Negative for congestion, ear pain, mouth sores, rhinorrhea, sinus pressure, sore throat and trouble swallowing.   Eyes: Negative for photophobia, pain and discharge.  Respiratory: Negative for cough, chest tightness and shortness of breath.   Cardiovascular: Negative for chest pain and leg swelling.  Gastrointestinal: Negative for abdominal distention, abdominal pain, diarrhea, nausea and vomiting.  Genitourinary: Negative for difficulty urinating and dysuria.  Musculoskeletal: Positive for back pain. Negative for arthralgias and gait problem.  Skin: Negative for color change and rash.  Neurological: Negative for dizziness and headaches.  Hematological: Negative for adenopathy.  Psychiatric/Behavioral: Negative for agitation and behavioral problems.     Physical Exam Updated Vital Signs BP (!) 151/97 (BP Location: Right Arm)   Pulse 68   Temp 98.4 F (36.9 C) (Oral)   Resp 18   Ht 6' (1.829 m)   Wt 113.4 kg   SpO2 98%   BMI 33.91 kg/m   Physical Exam  Constitutional: He is oriented to person, place, and time. He appears well-developed and well-nourished.  HENT:  Head: Normocephalic and atraumatic.  Nose: Nose normal.  Neck: Normal range of motion.  Cardiovascular: Normal rate.   Pulmonary/Chest:  Effort normal and breath sounds normal.  Abdominal: Soft. Bowel sounds are normal. He exhibits no distension. There is no tenderness. There is no guarding.  Musculoskeletal:       Lumbar back: He exhibits tenderness (Left side lumbar region) and spasm (Left side lumbar region).  Negative saddle anesthesia, urinary or fecal incontinence. Patient is nontender along the spinous process. He has left paravertebral muscle tenderness of muscle spasms noted along the lumbosacral junction. Full range of  motion of the lower extremities were no weakness or neurological deficit noted.  Neurological: He is alert and oriented to person, place, and time.  Skin: Skin is warm and dry.  Psychiatric: He has a normal mood and affect.     ED Treatments / Results  Labs (all labs ordered are listed, but only abnormal results are displayed) Labs Reviewed  URINALYSIS, COMPLETE (UACMP) WITH MICROSCOPIC - Abnormal; Notable for the following:       Result Value   Color, Urine YELLOW (*)    APPearance CLEAR (*)    All other components within normal limits    EKG  EKG Interpretation None       Radiology Dg Lumbar Spine Complete  Result Date: 02/02/2017 CLINICAL DATA:  Left mid back pain 1 week getting worse. EXAM: LUMBAR SPINE - COMPLETE 4+ VIEW COMPARISON:  None. FINDINGS: Vertebral body alignment and heights are normal. There is mild to moderate spondylosis of the lumbar spine to include facet arthropathy over the lower lumbar spine. No evidence of compression fracture. Subtle chronic anterior wedging of T12. Mild disc space narrowing at the L5-S1 level. IMPRESSION: No acute findings. 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 Electronically Signed   By: Elberta Fortis M.D.   On: 02/02/2017 20:48    Procedures Procedures (including critical care time)  Medications Ordered in ED Medications  ketorolac (TORADOL) injection 30 mg (30 mg Intramuscular Given 02/02/17 2043)  dexamethasone (DECADRON) injection 10 mg (10 mg Intramuscular Given 02/02/17 2043)     Initial Impression / Assessment and Plan / ED Course  I have reviewed the triage vital signs and the nursing notes.  Pertinent labs & imaging results that were available during my care of the patient were reviewed by me and considered in my medical decision making (see chart for details).     51 year old male with acute left lower lumbar strain. No neurological deficits. Given Toradol and  dexamethasone, pain improved significantly. He is sent home with prescription for anti-inflammatory medication and muscle relaxer. He is educated on activity modification and all signs and symptoms to return to clinic or ED for. He'll follow-up with orthopedics.  Final Clinical Impressions(s) / ED Diagnoses   Final diagnoses:  Strain of lumbar region, initial encounter  Muscle spasm of back    New Prescriptions Discharge Medication List as of 02/02/2017  9:26 PM    START taking these medications   Details  cyclobenzaprine (FLEXERIL) 5 MG tablet Take 1-2 tablets (5-10 mg total) by mouth 3 (three) times daily as needed for muscle spasms., Starting Tue 02/02/2017, Print    naproxen (NAPROSYN) 500 MG tablet Take 1 tablet (500 mg total) by mouth 2 (two) times daily with a meal., Starting Tue 02/02/2017, Print         Evon Slack, PA-C 02/02/17 2356    Merrily Brittle, MD 02/03/17 [id number]

## 2017-02-02 NOTE — ED Notes (Signed)
Patient lays brick for a living. Unsure if injury due to job. Patient having pain on left back that radiates down to his buttock. Denies urinary symptoms

## 2017-02-02 NOTE — Discharge Instructions (Signed)
Take medications as prescribed. Modify activities until symptoms have improved. Read attachments regarding injury prevention and home care. Return to the Emergency Department if symptoms do not improve or worsen.

## 2017-02-02 NOTE — ED Triage Notes (Signed)
c/o mid left back pain since last week that has become worse. Pain worse with movement. No meds PTA

## 2017-09-06 ENCOUNTER — Emergency Department
Admission: EM | Admit: 2017-09-06 | Discharge: 2017-09-06 | Disposition: A | Discharge status: Home / Self Care | Payer: Self-pay | Attending: Emergency Medicine | Admitting: Emergency Medicine

## 2017-09-06 DIAGNOSIS — I10 Essential (primary) hypertension: Secondary | ICD-10-CM | POA: Insufficient documentation

## 2017-09-06 DIAGNOSIS — F17210 Nicotine dependence, cigarettes, uncomplicated: Secondary | ICD-10-CM | POA: Insufficient documentation

## 2017-09-06 DIAGNOSIS — K047 Periapical abscess without sinus: Secondary | ICD-10-CM | POA: Insufficient documentation

## 2017-09-06 DIAGNOSIS — Z79899 Other long term (current) drug therapy: Secondary | ICD-10-CM | POA: Insufficient documentation

## 2017-09-06 MED ORDER — LIDOCAINE HCL (PF) 1 % IJ SOLN
5.0000 mL | Freq: Once | INTRAMUSCULAR | Status: AC
  Administered 2017-09-06: 5 mL via INTRADERMAL
  Filled 2017-09-06: qty 5

## 2017-09-06 MED ORDER — CLINDAMYCIN PHOSPHATE 600 MG/4ML IJ SOLN
600.0000 mg | Freq: Once | INTRAMUSCULAR | Status: AC
  Administered 2017-09-06: 600 mg via INTRAMUSCULAR
  Filled 2017-09-06: qty 4

## 2017-09-06 MED ORDER — OXYCODONE-ACETAMINOPHEN 5-325 MG PO TABS
1.0000 | ORAL_TABLET | Freq: Once | ORAL | Status: AC
  Administered 2017-09-06: 1 via ORAL
  Filled 2017-09-06: qty 1

## 2017-09-06 MED ORDER — CLINDAMYCIN HCL 300 MG PO CAPS: 300.0000 mg | ORAL_CAPSULE | Freq: Three times a day (TID) | ORAL | 0 refills | Status: AC

## 2017-09-06 NOTE — Discharge Instructions (Signed)
OPTIONS FOR DENTAL FOLLOW UP CARE ° °[place] [facility] and [facility] - Local Safety Net Dental Clinics °http://www.ncdhhs.gov/dph/oralhealth/services/safetynetclinics.htm °  °[facility] (336-562-3123) ° °[facility] [place] (919-933-9087) ° °[facility] ([phone] ext [phone]) ° °[facility] ([phone]) ° °[facility] ([phone]) °This clinic caters to the indigent population and is on a lottery system. °Location: °[facility], [address], [place] °Clinic Hours: °Wednesdays from 6pm - 9pm, patients seen by a lottery system. °For dates, call or go to www.med.[facility].edu/[facility]/patients/Dental-[facility] °Services: °Cleanings, fillings and simple extractions. °Payment Options: °DENTAL WORK IS FREE OF CHARGE. Bring proof of income or support. °Best way to get seen: °Arrive at 5:15 pm - this is a lottery, NOT first come/first serve, so arriving earlier will not increase your chances of being seen. °  °  °[facility] °[phone] °Select option 1 for emergencies °  °Location: °[facility], [address], [place] °Clinic Hours: °No walk-ins accepted - call the day before to schedule an appointment. °Check in times are 9:30 am and 1:30 pm. °Services: °Simple extractions, temporary fillings, pulpectomy/pulp debridement, uncomplicated abscess drainage. °Payment Options: °PAYMENT IS DUE AT THE TIME OF SERVICE.  Fee is usually $100-200, additional surgical procedures (e.g. abscess drainage) may be extra. °Cash, checks, Visa/MasterCard accepted.  Can file Medicaid if patient is covered for dental - patient should call case worker to check. °No discount for [facility] patients. °Best way to get seen: °MUST call the day before and get onto the schedule. Can usually be seen the next 1-2 days. No walk-ins accepted. °  °  °[facility] °[phone] °   °Location: °[facility], [address], [place] °Clinic Hours: °M, W, Th, F 8am or 1:30pm, Tues 9a or 1:30 - first come/first served. °Services: °Simple extractions, temporary fillings, uncomplicated abscess drainage.  You do not need to be an [place] resident. °Payment Options: °PAYMENT IS DUE AT THE TIME OF SERVICE. °Dental insurance, otherwise sliding scale - bring proof of income or support. °Depending on income and treatment needed, cost is usually $50-200. °Best way to get seen: °Arrive early as it is first come/first served. °  °  °[facility] °[phone] °  °Location: °[address] °Clinic Hours: °Mon-Thu 8a-5p °Services: °Most basic dental services including extractions and fillings. °Payment Options: °PAYMENT IS DUE AT THE TIME OF SERVICE. °Sliding scale, up to 50% off - bring proof if income or support. °Medicaid with dental option accepted. °Best way to get seen: °Call to schedule an appointment, can usually be seen within 2 weeks OR they will try to see walk-ins - show up at 8a or 2p (you may have to wait). °  °  °[facility] °919-245-2435 °[place] RESIDENTS ONLY °  °Location: °[facility], [address], [place], [place] [address] °Clinic Hours: By appointment only. °Monday - Thursday 8am-5pm, Friday 8am-12pm °Services: Cleanings, fillings, extractions. °Payment Options: °PAYMENT IS DUE AT THE TIME OF SERVICE. °Cash, Visa or MasterCard. Sliding scale - $30 minimum per service. °Best way to get seen: °Come in to office, complete packet and make an appointment - need proof of income °or support monies for each household member and proof of [place] residence. °Usually takes about a month to get in. °  °  °[facility] °[phone] °  °Location: °[address]   [place] °Clinic Hours: Walk-in Urgent Care Dental Services are offered Monday-Friday  mornings only. °The numbers of emergencies accepted daily is limited to the number of °providers available. °Maximum 15 - Mondays, Wednesdays & Thursdays °Maximum 10 - Tuesdays & Fridays °Services: °You do not need to be a [place] resident to be seen for a dental emergency. °Emergencies are defined as pain, swelling, abnormal bleeding, or dental trauma. Walkins will receive x-rays if needed. °NOTE: Dental cleaning is not an emergency. °Payment Options: °PAYMENT IS DUE AT THE TIME OF SERVICE. °Minimum co-pay is $40.00 for uninsured patients. °Minimum co-pay is $3.00 for Medicaid with dental coverage. °Dental Insurance is accepted and must be presented at time of visit. °Medicare does not cover dental. °Forms of payment: Cash, credit card, checks. °Best way to get seen: °If not previously registered with the clinic, walk-in dental registration begins at 7:15 am and is on a first come/first serve basis. °If previously registered with the clinic, call to make an appointment. °  °  °[facility] °[phone] °[place] RESIDENTS ONLY °  °Location: °[address], [place], [place] °Clinic Hours: °Mon-Thu 10a-2p °Services: Extractions only! °Payment Options: °FREE (donations accepted) - bring proof of income or support °Best way to get seen: °Call and schedule an appointment OR come at 8am on the 1st Monday of every month (except for holidays) when it is first come/first served. °  °  °[facility] °[phone] °  °Location: °[address], [place] °Clinic Hours: °Friday mornings °Services, Payment Options, Best way to get seen: °Call for info °

## 2017-09-06 NOTE — ED Triage Notes (Signed)
Patient c/o dental abscess abscess right upper mouth. Patient denies fever, chills, N/V.

## 2017-09-06 NOTE — ED Notes (Signed)
Pt states he has an abscess in roof of mouth. States its putting pressure in his sinuses and causing his head to ache really bad. Family is at bedside.

## 2017-09-06 NOTE — ED Provider Notes (Signed)
[facility] Emergency Department Provider Note  ____________________________________________  Time seen: Approximately 7:59 PM  I have reviewed the triage vital signs and the nursing notes.   HISTORY  Chief Complaint Abscess    HPI Dominic Foley is a 51 y.o. male that presents to the emergency department for evaluation of abscess to the roof of his mouth.  He states that he had a toothache on the upper right side a couple of days ago.  He did not notice the abscess until today.  He does not have a dentist.  He does not know if he had a fever but felt warm today.  No chills, drainage from mouth, difficulty opening and closing mouth, shortness of breath, nausea, vomiting, abdominal pain.   Past Medical History:  Diagnosis Date  . Hypertension    Has not taken BP Meds for about two years.    There are no active problems to display for this patient.   Past Surgical History:  Procedure Laterality Date  . ABDOMINAL SURGERY    . CHOLECYSTECTOMY      Prior to Admission medications   Medication Sig Start Date End Date Taking? Authorizing Provider  clindamycin (CLEOCIN) 300 MG capsule Take 1 capsule (300 mg total) 3 (three) times daily for 10 days by mouth. 09/06/17 09/16/17  Enid DerryWagner, Cristian Davitt, PA-C  cyclobenzaprine (FLEXERIL) 5 MG tablet Take 1-2 tablets (5-10 mg total) by mouth 3 (three) times daily as needed for muscle spasms. 02/02/17   Evon SlackGaines, Thomas C, PA-C  naproxen (NAPROSYN) 500 MG tablet Take 1 tablet (500 mg total) by mouth 2 (two) times daily with a meal. 02/02/17   Evon SlackGaines, Thomas C, PA-C    Allergies Patient has no known allergies.  No family history on file.  Social History Social History   Tobacco Use  . Smoking status: Current Every Day Smoker    Packs/day: 0.50    Types: Cigarettes  . Smokeless tobacco: Never Used  Substance Use Topics  . Alcohol use: No  . Drug use: No     Review of Systems  Cardiovascular: No chest  pain. Respiratory: No SOB. Gastrointestinal: No abdominal pain.  No nausea, no vomiting.  Musculoskeletal: Negative for musculoskeletal pain. Skin: Negative for rash, abrasions, lacerations, ecchymosis.   ____________________________________________   PHYSICAL EXAM:  VITAL SIGNS: ED Triage Vitals  Enc Vitals Group     BP 09/06/17 1906 (!) 147/107     Pulse Rate 09/06/17 1906 74     Resp 09/06/17 1906 17     Temp 09/06/17 1906 (!) 89.2 F (31.8 C)     Temp Source 09/06/17 1906 Oral     SpO2 09/06/17 1906 98 %     Weight 09/06/17 1908 250 lb (113.4 kg)     Height --      Head Circumference --      Peak Flow --      Pain Score 09/06/17 1906 5     Pain Loc --      Pain Edu? --      Excl. in GC? --      Constitutional: Alert and oriented. Well appearing and in no acute distress. Eyes: Conjunctivae are normal. PERRL. EOMI. Head: Atraumatic. ENT:      Ears:      Nose: No congestion/rhinnorhea.      Mouth/Throat: Mucous membranes are moist.  1 cm x 1 cm area of swelling and fluctuance to roof of mouth on right side.  Tenderness to palpation over  upper right incisor.  No drainage from mouth.  No TMJ pain.  No visible swelling to cheek or under chin. Neck: No stridor.  Cardiovascular: Normal rate, regular rhythm.  Good peripheral circulation. Respiratory: Normal respiratory effort without tachypnea or retractions. Lungs CTAB. Good air entry to the bases with no decreased or absent breath sounds. Musculoskeletal: Full range of motion to all extremities. No gross deformities appreciated. Neurologic:  Normal speech and language. No gross focal neurologic deficits are appreciated.  Skin:  Skin is warm, dry and intact. No rash noted.   ____________________________________________   LABS (all labs ordered are listed, but only abnormal results are displayed)  Labs Reviewed - No data to  display ____________________________________________  EKG   ____________________________________________  RADIOLOGY   No results found.  ____________________________________________    PROCEDURES  Procedure(s) performed:    Procedures  INCISION AND DRAINAGE Performed by: Enid DerryAshley Dalyn Kjos Consent: Verbal consent obtained. Risks and benefits: risks, benefits and alternatives were discussed Type: abscess  Body area: Roof of mouth  Anesthesia: local infiltration  Incision was made with a scalpel.  Local anesthetic: lidocaine 1 % without epinephrine  Anesthetic total: 2 ml  Complexity: complex Blunt dissection to break up loculations  Drainage: purulent  Drainage amount: 3 ccs  Packing material: None  Patient tolerance: Patient tolerated the procedure well with no immediate complications.    Medications  clindamycin (CLEOCIN) injection 600 mg (not administered)  oxyCODONE-acetaminophen (PERCOCET/ROXICET) 5-325 MG per tablet 1 tablet (not administered)  lidocaine (PF) (XYLOCAINE) 1 % injection 5 mL (5 mLs Intradermal Given by Other 09/06/17 2052)     ____________________________________________   INITIAL IMPRESSION / ASSESSMENT AND PLAN / ED COURSE  Pertinent labs & imaging results that were available during my care of the patient were reviewed by me and considered in my medical decision making (see chart for details).  Review of the [place] CSRS was performed in accordance of the [facility] prior to dispensing any controlled drugs.    Patient's diagnosis is consistent with dental abscess.  Vital signs and exam are reassuring.  Abscess was drained in ED.  IM clindamycin was given.  Patient will be discharged home with prescriptions for clindamycin. Patient is to follow up with dentist as directed.  Dental resources were provided.  Patient is given ED precautions to return to the ED for any worsening or new  symptoms.     ____________________________________________  FINAL CLINICAL IMPRESSION(S) / ED DIAGNOSES  Final diagnoses:  Dental abscess      NEW MEDICATIONS STARTED DURING THIS VISIT:    This chart was dictated using voice recognition software/Dragon. Despite best efforts to proofread, errors can occur which can change the meaning. Any change was purely unintentional.    Enid DerryWagner, Archibald Marchetta, PA-C 09/06/17 2128    Merrily Brittleifenbark, Neil, MD 09/06/17 2234

## 2018-01-06 ENCOUNTER — Other Ambulatory Visit: Payer: Self-pay

## 2018-01-06 ENCOUNTER — Ambulatory Visit
Admission: EM | Admit: 2018-01-06 | Discharge: 2018-01-06 | Disposition: A | Discharge status: Home / Self Care | Payer: Self-pay | Attending: Family Medicine | Admitting: Family Medicine

## 2018-01-06 ENCOUNTER — Encounter: Payer: Self-pay | Admitting: *Deleted

## 2018-01-06 DIAGNOSIS — I10 Essential (primary) hypertension: Secondary | ICD-10-CM

## 2018-01-06 DIAGNOSIS — R42 Dizziness and giddiness: Secondary | ICD-10-CM

## 2018-01-06 DIAGNOSIS — Z021 Encounter for pre-employment examination: Secondary | ICD-10-CM

## 2018-01-06 MED ORDER — AMLODIPINE BESYLATE 10 MG PO TABS: 10.0000 mg | ORAL_TABLET | Freq: Every day | ORAL | 1 refills | Status: AC

## 2018-01-06 NOTE — ED Triage Notes (Signed)
Patient has applied for a job that requires a physical. Patient was directed to seek treatment for hypertension. Patient was treated for HBP but stopped taking medications 3 years ago. Patient does complain of chronic dizziness.

## 2018-01-06 NOTE — ED Provider Notes (Signed)
[facility]  CSN: [id number] Arrival date & time: 01/06/18  1255  History   Chief Complaint Chief Complaint  Patient presents with  . Dizziness   HPI  52 year old male presents with hypertension.  Patient reports that he was recently seen for a physical prior to obtaining new employment.  He states that his blood pressure was elevated and he was encouraged to come in for evaluation and treatment.  He has a history of hypertension.  Has been off treatment for quite some time.  He states that he is feeling well.  No chest pain or shortness of breath.  He has dizziness occasionally.  Patient has no other complaints or concerns at this time.  Patient would like to restart blood pressure medication today.  Past Medical History:  Diagnosis Date  . Hypertension    Has not taken BP Meds for about two years.   Past Surgical History:  Procedure Laterality Date  . ABDOMINAL SURGERY    . CHOLECYSTECTOMY     Home Medications    Prior to Admission medications   Medication Sig Start Date End Date Taking? Authorizing Provider  amLODipine (NORVASC) 10 MG tablet Take 1 tablet (10 mg total) by mouth daily. 01/06/18   Tommie Sams, DO   Family History Family History  Problem Relation Age of Onset  . Diabetes Mother   . Cancer Father    Social History Social History   Tobacco Use  . Smoking status: Current Every Day Smoker    Packs/day: 0.50    Types: Cigarettes  . Smokeless tobacco: Never Used  Substance Use Topics  . Alcohol use: No  . Drug use: No   Allergies   Patient has no known allergies.   Review of Systems Review of Systems  Respiratory: Negative.   Cardiovascular: Negative.   Neurological:       Occasional dizziness.   Physical Exam Triage Vital Signs ED Triage Vitals  Enc Vitals Group     BP 01/06/18 1308 (!) 167/109     Pulse Rate 01/06/18 1308 (!) 101     Resp 01/06/18 1308 16     Temp 01/06/18 1308 99.1 F (37.3 C)     Temp Source 01/06/18  1308 Oral     SpO2 01/06/18 1308 100 %     Weight 01/06/18 1313 245 lb (111.1 kg)     Height 01/06/18 1313 5\' 11"  (1.803 m)     Head Circumference --      Peak Flow --      Pain Score 01/06/18 1313 0     Pain Loc --      Pain Edu? --      Excl. in GC? --    Updated Vital Signs BP (!) 167/109 (BP Location: Right Arm)   Pulse (!) 101   Temp 99.1 F (37.3 C) (Oral)   Resp 16   Ht 5\' 11"  (1.803 m)   Wt 245 lb (111.1 kg)   SpO2 100%   BMI 34.17 kg/m     Physical Exam  Constitutional: He is oriented to person, place, and time. He appears well-developed. No distress.  Cardiovascular: Normal rate and regular rhythm.  Pulmonary/Chest: Effort normal and breath sounds normal. He has no wheezes. He has no rales.  Neurological: He is alert and oriented to person, place, and time.  Psychiatric: He has a normal mood and affect. His behavior is normal.  Nursing note and vitals reviewed.  UC Treatments / Results  Labs (  all labs ordered are listed, but only abnormal results are displayed) Labs Reviewed - No data to display  EKG  EKG Interpretation None       Radiology No results found.  Procedures Procedures (including critical care time)  Medications Ordered in UC Medications - No data to display   Initial Impression / Assessment and Plan / UC Course  I have reviewed the triage vital signs and the nursing notes.  Pertinent labs & imaging results that were available during my care of the patient were reviewed by me and considered in my medical decision making (see chart for details).     52 year old male presents with uncontrolled hypertension.  Treating with amlodipine.  Final Clinical Impressions(s) / UC Diagnoses   Final diagnoses:  Essential hypertension    ED Discharge Orders        Ordered    amLODipine (NORVASC) 10 MG tablet  Daily     01/06/18 1325     Controlled Substance Prescriptions [place] Controlled Substance Registry consulted? Not Applicable     Tommie SamsCook, Clancy Mullarkey G, DO 01/06/18 1351

## 2018-11-21 ENCOUNTER — Other Ambulatory Visit: Payer: Self-pay

## 2018-11-21 ENCOUNTER — Emergency Department
Admission: EM | Admit: 2018-11-21 | Discharge: 2018-11-21 | Disposition: A | Discharge status: Home / Self Care | Payer: Self-pay | Attending: Student in an Organized Health Care Education/Training Program | Admitting: Student in an Organized Health Care Education/Training Program

## 2018-11-21 DIAGNOSIS — J101 Influenza due to other identified influenza virus with other respiratory manifestations: Secondary | ICD-10-CM | POA: Insufficient documentation

## 2018-11-21 DIAGNOSIS — R05 Cough: Secondary | ICD-10-CM | POA: Insufficient documentation

## 2018-11-21 DIAGNOSIS — M791 Myalgia, unspecified site: Secondary | ICD-10-CM | POA: Insufficient documentation

## 2018-11-21 DIAGNOSIS — Z79899 Other long term (current) drug therapy: Secondary | ICD-10-CM | POA: Insufficient documentation

## 2018-11-21 DIAGNOSIS — F17210 Nicotine dependence, cigarettes, uncomplicated: Secondary | ICD-10-CM | POA: Insufficient documentation

## 2018-11-21 DIAGNOSIS — I10 Essential (primary) hypertension: Secondary | ICD-10-CM | POA: Insufficient documentation

## 2018-11-21 MED ORDER — BENZONATATE 100 MG PO CAPS: 100.0000 mg | ORAL_CAPSULE | Freq: Three times a day (TID) | ORAL | 0 refills | Status: AC | PRN

## 2018-11-21 MED ORDER — OSELTAMIVIR PHOSPHATE 75 MG PO CAPS: 75.0000 mg | ORAL_CAPSULE | Freq: Two times a day (BID) | ORAL | 0 refills | Status: AC

## 2018-11-21 MED ORDER — ACETAMINOPHEN 500 MG PO TABS: 500.0000 mg | ORAL_TABLET | Freq: Four times a day (QID) | ORAL | 0 refills | Status: AC | PRN

## 2018-11-21 NOTE — ED Triage Notes (Signed)
Pt states "I got caught in the rain on Friday" since c/o of body aches, cough. A&O, ambulatory. No distress noted.

## 2018-11-21 NOTE — ED Notes (Signed)
See triage note  Presents with body aches cough and subjective fever   States sxs' started on Saturday  Low grade fever noted on arrival

## 2018-11-21 NOTE — ED Provider Notes (Signed)
[facility] Emergency Department Provider Note  ____________________________________________  Time seen: Approximately 3:20 PM  I have reviewed the triage vital signs and the nursing notes.   HISTORY  Chief Complaint Cough and Generalized Body Aches    HPI Dominic Foley is a 53 y.o. male that presents to the emergency department for evaluation of nasal congestion, cough, body aches for 2-3 days.  Children are sick with same symptoms.  Patient smokes a pack of cigarettes per day.  No headache, sore throat, nausea, vomiting, abdominal pain.   Past Medical History:  Diagnosis Date  . Hypertension    Has not taken BP Meds for about two years.    There are no active problems to display for this patient.   Past Surgical History:  Procedure Laterality Date  . ABDOMINAL SURGERY    . CHOLECYSTECTOMY      Prior to Admission medications   Medication Sig Start Date End Date Taking? Authorizing Provider  acetaminophen (TYLENOL) 500 MG tablet Take 1 tablet (500 mg total) by mouth every 6 (six) hours as needed. 11/21/18   Enid Derry, PA-C  amLODipine (NORVASC) 10 MG tablet Take 1 tablet (10 mg total) by mouth daily. 01/06/18   Tommie Sams, DO  benzonatate (TESSALON PERLES) 100 MG capsule Take 1 capsule (100 mg total) by mouth 3 (three) times daily as needed for cough. 11/21/18 11/21/19  Enid Derry, PA-C  oseltamivir (TAMIFLU) 75 MG capsule Take 1 capsule (75 mg total) by mouth 2 (two) times daily for 5 days. 11/21/18 11/26/18  Enid Derry, PA-C    Allergies Patient has no known allergies.  Family History  Problem Relation Age of Onset  . Diabetes Mother   . Cancer Father     Social History Social History   Tobacco Use  . Smoking status: Current Every Day Smoker    Packs/day: 0.50    Types: Cigarettes  . Smokeless tobacco: Never Used  Substance Use Topics  . Alcohol use: No  . Drug use: No     Review of Systems  Constitutional: No  fever/chills Eyes: No visual changes. No discharge. ENT: Negative for congestion and rhinorrhea. Cardiovascular: No chest pain. Respiratory: Positive for cough. No SOB. Gastrointestinal: No abdominal pain.  No nausea, no vomiting.  No diarrhea.  No constipation. Musculoskeletal: Positive for body aches. Skin: Negative for rash, abrasions, lacerations, ecchymosis. Neurological: Positive for headache.   ____________________________________________   PHYSICAL EXAM:  VITAL SIGNS: ED Triage Vitals  Enc Vitals Group     BP 11/21/18 1322 (!) 155/108     Pulse Rate 11/21/18 1322 86     Resp --      Temp 11/21/18 1322 99.4 F (37.4 C)     Temp Source 11/21/18 1322 Oral     SpO2 11/21/18 1322 97 %     Weight 11/21/18 1256 230 lb (104.3 kg)     Height 11/21/18 1256 6' (1.829 m)     Head Circumference --      Peak Flow --      Pain Score 11/21/18 1256 8     Pain Loc --      Pain Edu? --      Excl. in GC? --      Constitutional: Alert and oriented. Well appearing and in no acute distress. Eyes: Conjunctivae are normal. PERRL. EOMI. No discharge. Head: Atraumatic. ENT: No frontal and maxillary sinus tenderness.      Ears: Tympanic membranes pearly gray with good landmarks.  No discharge.      Nose: Mild congestion/rhinnorhea.      Mouth/Throat: Mucous membranes are moist. Oropharynx non-erythematous. Tonsils not enlarged. No exudates. Uvula midline. Neck: No stridor.   Hematological/Lymphatic/Immunilogical: No cervical lymphadenopathy. Cardiovascular: Normal rate, regular rhythm.  Good peripheral circulation. Respiratory: Normal respiratory effort without tachypnea or retractions. Lungs CTAB. Good air entry to the bases with no decreased or absent breath sounds. Gastrointestinal: Bowel sounds 4 quadrants. Soft and nontender to palpation. No guarding or rigidity. No palpable masses. No distention. Musculoskeletal: Full range of motion to all extremities. No gross deformities  appreciated. Neurologic:  Normal speech and language. No gross focal neurologic deficits are appreciated.  Skin:  Skin is warm, dry and intact. No rash noted. Psychiatric: Mood and affect are normal. Speech and behavior are normal. Patient exhibits appropriate insight and judgement.   ____________________________________________   LABS (all labs ordered are listed, but only abnormal results are displayed)  Labs Reviewed - No data to display ____________________________________________  EKG   ____________________________________________  RADIOLOGY   No results found.  ____________________________________________    PROCEDURES  Procedure(s) performed:    Procedures    Medications - No data to display   ____________________________________________   INITIAL IMPRESSION / ASSESSMENT AND PLAN / ED COURSE  Pertinent labs & imaging results that were available during my care of the patient were reviewed by me and considered in my medical decision making (see chart for details).  Review of the [place] CSRS was performed in accordance of the [facility] prior to dispensing any controlled drugs.     Patient's diagnosis is consistent with influenza. Vital signs and exam are reassuring. Both children are here with patient in the emergency department and both children tested positive for influenza A.  Patient will be treated for influenza.  Patient appears well and is staying well hydrated. Patient should alternate tylenol and ibuprofen for fever. Patient feels comfortable going home. Patient will be discharged home with prescriptions for Tamiflu, Tessalon Perles, Motrin, Tylenol. Patient is to follow up with primary care as needed or otherwise directed. Patient is given ED precautions to return to the ED for any worsening or new symptoms.     ____________________________________________  FINAL CLINICAL IMPRESSION(S) / ED DIAGNOSES  Final diagnoses:  Influenza A      NEW  MEDICATIONS STARTED DURING THIS VISIT:  ED Discharge Orders         Ordered    oseltamivir (TAMIFLU) 75 MG capsule  2 times daily     11/21/18 1526    benzonatate (TESSALON PERLES) 100 MG capsule  3 times daily PRN     11/21/18 1526    acetaminophen (TYLENOL) 500 MG tablet  Every 6 hours PRN     11/21/18 1526              This chart was dictated using voice recognition software/Dragon. Despite best efforts to proofread, errors can occur which can change the meaning. Any change was purely unintentional.    Enid Derry, PA-C 11/21/18 1625    Willy Eddy, MD 11/22/18 [phone]

## 2022-03-04 ENCOUNTER — Other Ambulatory Visit: Payer: Self-pay

## 2022-03-04 ENCOUNTER — Ambulatory Visit
Admission: EM | Admit: 2022-03-04 | Discharge: 2022-03-04 | Disposition: A | Discharge status: Home / Self Care | Payer: 59

## 2022-03-04 ENCOUNTER — Ambulatory Visit (INDEPENDENT_AMBULATORY_CARE_PROVIDER_SITE_OTHER): Payer: 59

## 2022-03-04 ENCOUNTER — Ambulatory Visit: Payer: Self-pay

## 2022-03-04 DIAGNOSIS — M25562 Pain in left knee: Secondary | ICD-10-CM | POA: Diagnosis not present

## 2022-03-04 DIAGNOSIS — M1712 Unilateral primary osteoarthritis, left knee: Secondary | ICD-10-CM

## 2022-03-04 MED ORDER — DICLOFENAC SODIUM 75 MG PO TBEC
75.0000 mg | DELAYED_RELEASE_TABLET | Freq: Two times a day (BID) | ORAL | 0 refills | Status: AC | PRN
Start: 2022-03-04 — End: 2022-03-19

## 2022-03-04 MED ORDER — TRAMADOL HCL 50 MG PO TABS: 50.0000 mg | ORAL_TABLET | Freq: Four times a day (QID) | ORAL | 0 refills | Status: AC | PRN

## 2022-03-04 NOTE — Discharge Instructions (Addendum)
-  X-ray shows degenerative changes or arthritis.  I think you are having a flareup of underlying arthritis. ?- Ice and elevate the knee frequently. ?- Use the supportive knee brace. ?- I sent anti-inflammatory medicine to the pharmacy.  You can also take Tylenol.  Additionally I sent tramadol which is a pain medication that you can take for severe pain.  Also you can use topical Voltaren gel that you may purchase over-the-counter.  If no improvement in the next 7 to 10 days or worsening of symptoms you should follow-up with orthopedics.  See more information below. ? ?You have a condition requiring you to follow up with Orthopedics so please call one of the following office for appointment:  ? ?[facility] ?[address] [place], [place] [address] ?Phone: [phone] ? ?[facility] ?[address], [place], [place] [address] ?Phone: [phone]  ?

## 2022-03-04 NOTE — ED Provider Notes (Signed)
?[facility] ? ? ? ?CSN: [id number] ?Arrival date & time: 03/04/22  1522 ? ? ?  ? ?History   ?Chief Complaint ?Chief Complaint  ?Patient presents with  ? Knee Pain  ? ? ?HPI ?Dominic Foley is a 56 y.o. male presenting for left knee pain and swelling for the past 3 days.  Patient says he thinks he twisted his knee but he does not recall a specific injury.  He says it was hurting so he put an Ace bandage on it and then he took the Ace bandage off and it started to hurt worse.  He reports he has been taking Aleve for the pain.  He says the pain is worse whenever he tries to bear weight on the knee or changes positions from sitting to standing.  Increased pain when fully flexing or extending the knee.  Reports he cannot really flex beyond 90 degrees without severe pain.  Pain is 5 out of 10 but increases higher than that when he is doing exacerbating activities.  Reports occasional sensations of weakness, like that he is getting give out.  Denies any issues with knee pain or problems in the past. ? ?HPI ? ?Past Medical History:  ?Diagnosis Date  ? Hypertension   ? Has not taken BP Meds for about two years.  ? ? ?There are no problems to display for this patient. ? ? ?Past Surgical History:  ?Procedure Laterality Date  ? ABDOMINAL SURGERY    ? CHOLECYSTECTOMY    ? ? ? ? ? ?Home Medications   ? ?Prior to Admission medications   ?Medication Sig Start Date End Date Taking? Authorizing Provider  ?diclofenac (VOLTAREN) 75 MG EC tablet Take 1 tablet (75 mg total) by mouth 2 (two) times daily as needed for up to 15 days. 03/04/22 03/19/22 Yes Eusebio FriendlyEaves, Ranyah Groeneveld B, PA-C  ?losartan (COZAAR) 50 MG tablet Take 1 tablet by mouth daily. 08/26/21 08/26/22 Yes [provider]  ?traMADol (ULTRAM) 50 MG tablet Take 1 tablet (50 mg total) by mouth every 6 (six) hours as needed for up to 3 days. 03/04/22 03/07/22 Yes Eusebio FriendlyEaves, Antoney Biven B, PA-C  ?acetaminophen (TYLENOL) 500 MG tablet Take 1 tablet (500 mg total) by mouth every 6 (six)  hours as needed. 11/21/18   Enid DerryWagner, Ashley, PA-C  ?amLODipine (NORVASC) 10 MG tablet Take 1 tablet (10 mg total) by mouth daily. 01/06/18   Tommie Samsook, Jayce G, DO  ? ? ?Family History ?Family History  ?Problem Relation Age of Onset  ? Diabetes Mother   ? Cancer Father   ? ? ?Social History ?Social History  ? ?Tobacco Use  ? Smoking status: Every Day  ?  Packs/day: 0.50  ?  Types: Cigarettes  ? Smokeless tobacco: Never  ?Vaping Use  ? Vaping Use: Never used  ?Substance Use Topics  ? Alcohol use: No  ? Drug use: Yes  ?  Types: Marijuana  ?  Comment: last use 3 days ago  ? ? ? ?Allergies   ?Lisinopril ? ? ?Review of Systems ?Review of Systems  ?Musculoskeletal:  Positive for arthralgias, gait problem (pain on weightbearing) and joint swelling.  ?Skin:  Negative for color change.  ?Neurological:  Negative for weakness and numbness.  ? ? ?Physical Exam ?Triage Vital Signs ?ED Triage Vitals  ?Enc Vitals Group  ?   BP --   ?   Pulse --   ?   Resp --   ?   Temp --   ?  Temp src --   ?   SpO2 --   ?   Weight 03/04/22 1543 240 lb (108.9 kg)  ?   Height 03/04/22 1543 6' (1.829 m)  ?   Head Circumference --   ?   Peak Flow --   ?   Pain Score 03/04/22 1542 4  ?   Pain Loc --   ?   Pain Edu? --   ?   Excl. in GC? --   ? ?No data found. ? ?Updated Vital Signs ?BP (!) 143/105 (BP Location: Left Arm) Comment: Pt just took his B/P medicine prior to arrival  Pulse 77   Temp 98.9 ?F (37.2 ?C) (Oral)   Resp 17   Ht 6' (1.829 m)   Wt 240 lb (108.9 kg)   SpO2 98%   BMI 32.55 kg/m?  ? ? ?Physical Exam ?Vitals and nursing note reviewed.  ?Constitutional:   ?   General: He is not in acute distress. ?   Appearance: Normal appearance. He is well-developed. He is not ill-appearing.  ?HENT:  ?   Head: Normocephalic and atraumatic.  ?Eyes:  ?   General: No scleral icterus. ?   Conjunctiva/sclera: Conjunctivae normal.  ?Cardiovascular:  ?   Rate and Rhythm: Normal rate.  ?   Pulses: Normal pulses.  ?Pulmonary:  ?   Effort: Pulmonary effort is  normal. No respiratory distress.  ?Musculoskeletal:  ?   Cervical back: Neck supple.  ?   Left knee: Swelling (mild) and bony tenderness (medial and lateral joint lines) present. No deformity, erythema or ecchymosis. Decreased range of motion (decreased beyond 90 degrees flexion). Normal pulse.  ?Skin: ?   General: Skin is warm and dry.  ?   Capillary Refill: Capillary refill takes less than 2 seconds.  ?Neurological:  ?   General: No focal deficit present.  ?   Mental Status: He is alert. Mental status is at baseline.  ?   Motor: No weakness.  ?   Gait: Gait abnormal.  ?Psychiatric:     ?   Mood and Affect: Mood normal.     ?   Behavior: Behavior normal.     ?   Thought Content: Thought content normal.  ? ? ? ?UC Treatments / Results  ?Labs ?(all labs ordered are listed, but only abnormal results are displayed) ?Labs Reviewed - No data to display ? ?EKG ? ? ?Radiology ?DG Knee Complete 4 Views Left ? ?Result Date: 03/04/2022 ?CLINICAL DATA:  Pain and swelling EXAM: LEFT KNEE - COMPLETE 4+ VIEW COMPARISON:  None Available. FINDINGS: No recent fracture or dislocation is seen. Degenerative changes with bony spurs are noted in the patella. There is soft tissue fullness in the suprapatellar bursa. There is large 4.5 cm smooth marginated calcific density anterior to the proximal tibia, possibly suggesting remote injury to the anterior tibial tuberosity. There is linear coarse calcification in the upper margin of patella at the insertion of quadriceps tendon, possibly calcific tendinosis. IMPRESSION: No recent fracture or dislocation is seen in the left knee. Possible small effusion is seen in the suprapatellar bursa. Degenerative changes are noted with bony spurs in the patella. Smooth marginated calcific density anterior to the proximal tibia may be residual from previous injury. Electronically Signed   By: Ernie Avena M.D.   On: 03/04/2022 16:42   ? ?Procedures ?Procedures (including critical care  time) ? ?Medications Ordered in UC ?Medications - No data to display ? ?Initial Impression / Assessment and  Plan / UC Course  ?I have reviewed the triage vital signs and the nursing notes. ? ?Pertinent labs & imaging results that were available during my care of the patient were reviewed by me and considered in my medical decision making (see chart for details). ? ?56 year old male presenting for left knee pain and swelling for the past 3 days.  No trauma.  No history of arthritis.  On exam he does have mild swelling of the anterior knee with tenderness palpation of the medial and lateral aspects of knee.  Reduced range of motion to 90 degrees flexion.  Can fully extend the knee with pain.  No instability. ? ?X-ray obtained shows no recent fracture or dislocation.,  Small effusion in the suprapatellar bursa and degenerative changes with bony spurs in the patella.  Discussed results with patient.  Advised him I suspect he likely has a flareup of underlying osteoarthritis of the knee.  Patient given supportive knee brace.  Sent diclofenac and Ultram.  Reviewed RICE guidelines.  Advised following up with orthopedics if not improving over the next 7 to 10 days or if symptoms were to worsen. ? ?Final Clinical Impressions(s) / UC Diagnoses  ? ?Final diagnoses:  ?Acute pain of left knee  ?Arthritis of knee, left  ? ? ? ?Discharge Instructions   ? ?  ?-X-ray shows degenerative changes or arthritis.  I think you are having a flareup of underlying arthritis. ?- Ice and elevate the knee frequently. ?- Use the supportive knee brace. ?- I sent anti-inflammatory medicine to the pharmacy.  You can also take Tylenol.  Additionally I sent tramadol which is a pain medication that you can take for severe pain.  Also you can use topical Voltaren gel that you may purchase over-the-counter.  If no improvement in the next 7 to 10 days or worsening of symptoms you should follow-up with orthopedics.  See more information below. ? ?You have  a condition requiring you to follow up with Orthopedics so please call one of the following office for appointment:  ? ?[facility] ?[address], [place], [place] [address] ?Phone: [phone] ? ?[facility] ?[address]

## 2022-03-04 NOTE — ED Triage Notes (Signed)
Pt states his left knee was bothering him over the weekend and then perhaps twisted it on Sunday or Monday. No fall. Endorses pain to upper calf, entire circumference of knee.  ?

## 2023-05-08 ENCOUNTER — Ambulatory Visit (INDEPENDENT_AMBULATORY_CARE_PROVIDER_SITE_OTHER): Payer: BLUE CROSS/BLUE SHIELD

## 2023-05-08 ENCOUNTER — Encounter: Payer: Self-pay | Admitting: Emergency Medicine

## 2023-05-08 ENCOUNTER — Ambulatory Visit
Admission: EM | Admit: 2023-05-08 | Discharge: 2023-05-08 | Disposition: A | Discharge status: Home / Self Care | Payer: BLUE CROSS/BLUE SHIELD | Source: Home / Self Care

## 2023-05-08 DIAGNOSIS — F17210 Nicotine dependence, cigarettes, uncomplicated: Secondary | ICD-10-CM | POA: Insufficient documentation

## 2023-05-08 DIAGNOSIS — Z1152 Encounter for screening for COVID-19: Secondary | ICD-10-CM | POA: Diagnosis not present

## 2023-05-08 DIAGNOSIS — R509 Fever, unspecified: Secondary | ICD-10-CM

## 2023-05-08 DIAGNOSIS — B349 Viral infection, unspecified: Secondary | ICD-10-CM | POA: Diagnosis present

## 2023-05-08 DIAGNOSIS — R0989 Other specified symptoms and signs involving the circulatory and respiratory systems: Secondary | ICD-10-CM | POA: Insufficient documentation

## 2023-05-08 DIAGNOSIS — R051 Acute cough: Secondary | ICD-10-CM | POA: Diagnosis not present

## 2023-05-08 DIAGNOSIS — R059 Cough, unspecified: Secondary | ICD-10-CM | POA: Diagnosis not present

## 2023-05-08 LAB — SARS CORONAVIRUS 2 BY RT PCR: SARS Coronavirus 2 by RT PCR: NEGATIVE

## 2023-05-08 MED ORDER — PROMETHAZINE-DM 6.25-15 MG/5ML PO SYRP: 5.0000 mL | ORAL_SOLUTION | Freq: Four times a day (QID) | ORAL | 0 refills | Status: DC | PRN

## 2023-05-08 NOTE — ED Provider Notes (Signed)
[facility]    CSN: [id number] Arrival date & time: 05/08/23  4098      History   Chief Complaint Chief Complaint  Patient presents with   Cough    HPI Dominic Foley is a 57 y.o. male presenting for 4-5 day history of fatigue, body aches, watery eyes, headache, sore throat, cough, chest congestion and tightness, sweats. Reports occasional sneezing. Denies abdominal pain, vomiting or diarrhea. Denies chest pain or shortness of breath. Denies any sick contacts. Has been isolating at home. Has tried OTC meds for headache.   HPI  Past Medical History:  Diagnosis Date   Hypertension    Has not taken BP Meds for about two years.    There are no problems to display for this patient.   Past Surgical History:  Procedure Laterality Date   ABDOMINAL SURGERY     CHOLECYSTECTOMY         Home Medications    Prior to Admission medications   Medication Sig Start Date End Date Taking? Authorizing Provider  amLODipine (NORVASC) 10 MG tablet Take 1 tablet (10 mg total) by mouth daily. 01/06/18  Yes Cook, Jayce G, DO  losartan (COZAAR) 50 MG tablet Take 1 tablet by mouth daily. 08/26/21 05/08/23 Yes [provider]  promethazine-dextromethorphan (PROMETHAZINE-DM) 6.25-15 MG/5ML syrup Take 5 mLs by mouth 4 (four) times daily as needed. 05/08/23  Yes Shirlee Latch, PA-C  acetaminophen (TYLENOL) 500 MG tablet Take 1 tablet (500 mg total) by mouth every 6 (six) hours as needed. 11/21/18   Enid Derry, PA-C    Family History Family History  Problem Relation Age of Onset   Diabetes Mother    Cancer Father     Social History Social History   Tobacco Use   Smoking status: Every Day    Current packs/day: 0.50    Types: Cigarettes   Smokeless tobacco: Never  Vaping Use   Vaping status: Never Used  Substance Use Topics   Alcohol use: No   Drug use: Yes    Types: Marijuana    Comment: last use 3 days ago     Allergies   Lisinopril   Review of  Systems Review of Systems  Constitutional:  Positive for diaphoresis and fatigue. Negative for fever.  HENT:  Positive for congestion, rhinorrhea, sneezing and sore throat. Negative for ear pain, sinus pressure and sinus pain.   Respiratory:  Positive for cough and chest tightness. Negative for shortness of breath.   Cardiovascular:  Negative for chest pain.  Gastrointestinal:  Negative for abdominal pain, diarrhea, nausea and vomiting.  Musculoskeletal:  Negative for myalgias.  Neurological:  Positive for headaches. Negative for light-headedness.  Hematological:  Negative for adenopathy.     Physical Exam Triage Vital Signs ED Triage Vitals  Encounter Vitals Group     BP 05/08/23 0952 (!) 165/97     Systolic BP Percentile --      Diastolic BP Percentile --      Pulse Rate 05/08/23 0952 80     Resp 05/08/23 0952 18     Temp 05/08/23 0952 98.6 F (37 C)     Temp Source 05/08/23 0952 Oral     SpO2 05/08/23 0952 96 %     Weight 05/08/23 0951 240 lb 1.3 oz (108.9 kg)     Height 05/08/23 0951 6' (1.829 m)     Head Circumference --      Peak Flow --      Pain Score  05/08/23 0950 8     Pain Loc --      Pain Education --      Exclude from Growth Chart --    No data found.  Updated Vital Signs BP (!) 165/97 (BP Location: Right Arm)   Pulse 80   Temp 98.6 F (37 C) (Oral)   Resp 18   Ht 6' (1.829 m)   Wt 240 lb 1.3 oz (108.9 kg)   SpO2 96%   BMI 32.56 kg/m       Physical Exam Vitals and nursing note reviewed.  Constitutional:      General: He is not in acute distress.    Appearance: Normal appearance. He is well-developed. He is not ill-appearing.  HENT:     Head: Normocephalic and atraumatic.     Nose: Congestion present.     Mouth/Throat:     Mouth: Mucous membranes are moist.     Pharynx: Oropharynx is clear. Posterior oropharyngeal erythema present.  Eyes:     General: No scleral icterus.    Conjunctiva/sclera: Conjunctivae normal.  Cardiovascular:     Rate  and Rhythm: Normal rate and regular rhythm.     Heart sounds: Normal heart sounds.  Pulmonary:     Effort: Pulmonary effort is normal. No respiratory distress.     Breath sounds: Rhonchi (few scattered rhonchi) present.  Musculoskeletal:     Cervical back: Neck supple.  Skin:    General: Skin is warm and dry.     Capillary Refill: Capillary refill takes less than 2 seconds.  Neurological:     General: No focal deficit present.     Mental Status: He is alert. Mental status is at baseline.     Motor: No weakness.     Gait: Gait normal.  Psychiatric:        Mood and Affect: Mood normal.        Behavior: Behavior normal.      UC Treatments / Results  Labs (all labs ordered are listed, but only abnormal results are displayed) Labs Reviewed  SARS CORONAVIRUS 2 BY RT PCR    EKG   Radiology DG Chest 2 View  Result Date: 05/08/2023 CLINICAL DATA:  Cough and fever with chest tightness. EXAM: CHEST - 2 VIEW COMPARISON:  None Available. FINDINGS: Normal heart size and mediastinal contours. No acute infiltrate or edema. No effusion or pneumothorax. No acute osseous findings. IMPRESSION: Negative chest. Electronically Signed   By: Tiburcio Pea M.D.   On: 05/08/2023 10:36    Procedures Procedures (including critical care time)  Medications Ordered in UC Medications - No data to display  Initial Impression / Assessment and Plan / UC Course  I have reviewed the triage vital signs and the nursing notes.  Pertinent labs & imaging results that were available during my care of the patient were reviewed by me and considered in my medical decision making (see chart for details).   57 year old male presents for fatigue, body aches, cough, chest congestion/tightness x 4 to 5 days.  Denies recorded fever but has felt feverish.  No sick contacts.  Patient is afebrile.  Overall well-appearing but he coughs frequently.  On exam has nasal congestion.  Mild posterior pharyngeal erythema.  Few  scattered rhonchi throughout chest.  COVID test obtained as well as chest x-ray.  Negative COVID.  Normal x-ray.  Reviewed results with patient.  Viral illness.  Supportive care encouraged with increasing rest and fluids.  Sent Promethazine DM to pharmacy.  Reviewed return precautions.  Work note given.   Final Clinical Impressions(s) / UC Diagnoses   Final diagnoses:  Viral illness  Acute cough  Chest congestion     Discharge Instructions      -Negative COVID -No pneumonia.  URI/COLD SYMPTOMS: Your exam today is consistent with a viral illness. Antibiotics are not indicated at this time. Use medications as directed, including cough syrup, nasal saline, and decongestants. Your symptoms should improve over the next few days and resolve within 7-10 days. Increase rest and fluids. F/u if symptoms worsen or predominate such as sore throat, ear pain, productive cough, shortness of breath, or if you develop high fevers or worsening fatigue over the next several days.       ED Prescriptions     Medication Sig Dispense Auth. Provider   promethazine-dextromethorphan (PROMETHAZINE-DM) 6.25-15 MG/5ML syrup Take 5 mLs by mouth 4 (four) times daily as needed. 118 mL Shirlee Latch, PA-C      PDMP not reviewed this encounter.   Shirlee Latch, PA-C 05/08/23 1046

## 2023-05-08 NOTE — Discharge Instructions (Signed)
-  Negative COVID -No pneumonia.  URI/COLD SYMPTOMS: Your exam today is consistent with a viral illness. Antibiotics are not indicated at this time. Use medications as directed, including cough syrup, nasal saline, and decongestants. Your symptoms should improve over the next few days and resolve within 7-10 days. Increase rest and fluids. F/u if symptoms worsen or predominate such as sore throat, ear pain, productive cough, shortness of breath, or if you develop high fevers or worsening fatigue over the next several days.

## 2023-05-08 NOTE — ED Triage Notes (Signed)
Pt c/o cough, headache, chest tightness, sweats, weakness, nasal congestion. Unknown if he has had a fever. Started about 5 days ago.

## 2023-10-01 ENCOUNTER — Ambulatory Visit
Admission: RE | Admit: 2023-10-01 | Discharge: 2023-10-01 | Disposition: A | Discharge status: Home / Self Care | Payer: BLUE CROSS/BLUE SHIELD | Source: Ambulatory Visit | Attending: Emergency Medicine | Admitting: Emergency Medicine

## 2023-10-01 VITALS — BP 129/89 | HR 82 | Temp 98.2°F | Resp 16 | Ht 72.0 in | Wt 244.0 lb

## 2023-10-01 DIAGNOSIS — J069 Acute upper respiratory infection, unspecified: Secondary | ICD-10-CM | POA: Diagnosis not present

## 2023-10-01 LAB — SARS CORONAVIRUS 2 BY RT PCR: SARS Coronavirus 2 by RT PCR: NEGATIVE

## 2023-10-01 MED ORDER — AMOXICILLIN-POT CLAVULANATE 875-125 MG PO TABS: 1.0000 | ORAL_TABLET | Freq: Two times a day (BID) | ORAL | 0 refills | Status: AC

## 2023-10-01 MED ORDER — IPRATROPIUM BROMIDE 0.06 % NA SOLN: 2.0000 | Freq: Four times a day (QID) | NASAL | 12 refills | Status: AC

## 2023-10-01 NOTE — ED Triage Notes (Signed)
Patient c/o sinus congestion, nasal congestion, and headache that started 4-5 days ago.  Patient unsure of fevers.

## 2023-10-01 NOTE — Discharge Instructions (Addendum)
The Augmentin twice daily with food for 10 days for treatment of your URI.  Perform sinus irrigation 2-3 times a day with a NeilMed sinus rinse kit and distilled water.  Do not use tap water.  You can use plain over-the-counter Mucinex every 6 hours to break up the stickiness of the mucus so your body can clear it.  Increase your oral fluid intake to thin out your mucus so that is also able for your body to clear more easily.  Take an over-the-counter probiotic, such as Culturelle-align-activia, 1 hour after each dose of antibiotic to prevent diarrhea.  Use the Atrovent nasal spray, 2 squirts up each nostril every 6 hours, as needed for nasal congestion and postnasal drip.  If you develop any new or worsening symptoms return for reevaluation or see your primary care provider.

## 2023-10-01 NOTE — ED Provider Notes (Signed)
[facility]    CSN: [id number] Arrival date & time: 10/01/23  0850      History   Chief Complaint Chief Complaint  Patient presents with   Sinus Problem   Headache    HPI Dominic Foley is a 57 y.o. male.   HPI  57 year old male with a past medical history significant for hypertension presents for evaluation of 4 to 5 days worth of respiratory symptoms to include headache, nasal congestion, and sinus pressure.  He reports he is experiencing postnasal drip with a acrid taste.  He also has a slight, infrequent nonproductive cough.  He denies any fever, ear pain, sore throat, shortness breath, wheezing, or GI complaints.  Past Medical History:  Diagnosis Date   Hypertension    Has not taken BP Meds for about two years.    There are no active problems to display for this patient.   Past Surgical History:  Procedure Laterality Date   ABDOMINAL SURGERY     CHOLECYSTECTOMY         Home Medications    Prior to Admission medications   Medication Sig Start Date End Date Taking? Authorizing Provider  amLODipine (NORVASC) 10 MG tablet Take 1 tablet (10 mg total) by mouth daily. 01/06/18  Yes Cook, Jayce G, DO  amoxicillin-clavulanate (AUGMENTIN) 875-125 MG tablet Take 1 tablet by mouth every 12 (twelve) hours for 10 days. 10/01/23 10/11/23 Yes Becky Augusta, NP  atorvastatin (LIPITOR) 20 MG tablet Take 1 tablet by mouth daily. 08/05/23  Yes [provider]  ipratropium (ATROVENT) 0.06 % nasal spray Place 2 sprays into both nostrils 4 (four) times daily. 10/01/23  Yes Becky Augusta, NP  losartan-hydrochlorothiazide (HYZAAR) 100-25 MG tablet Take 1 tablet by mouth every morning. 09/13/23  Yes [provider]  spironolactone (ALDACTONE) 25 MG tablet Take by mouth. 09/13/23  Yes [provider]  acetaminophen (TYLENOL) 500 MG tablet Take 1 tablet (500 mg total) by mouth every 6 (six) hours as needed. 11/21/18   Enid Derry, PA-C  naproxen  (NAPROSYN) 500 MG tablet Take 500 mg by mouth 2 (two) times daily.   Yes [provider]    Family History Family History  Problem Relation Age of Onset   Diabetes Mother    Cancer Father     Social History Social History   Tobacco Use   Smoking status: Every Day    Current packs/day: 0.50    Types: Cigarettes   Smokeless tobacco: Never  Vaping Use   Vaping status: Never Used  Substance Use Topics   Alcohol use: No   Drug use: Yes    Types: Marijuana    Comment: last use 3 days ago     Allergies   Lisinopril   Review of Systems Review of Systems  Constitutional:  Negative for fever.  HENT:  Positive for congestion, postnasal drip and rhinorrhea. Negative for ear pain and sore throat.   Respiratory:  Positive for cough. Negative for shortness of breath and wheezing.   Gastrointestinal:  Negative for diarrhea, nausea and vomiting.  Neurological:  Positive for headaches.     Physical Exam Triage Vital Signs ED Triage Vitals  Encounter Vitals Group     BP 10/01/23 0914 129/89     Systolic BP Percentile --      Diastolic BP Percentile --      Pulse Rate 10/01/23 0914 82     Resp 10/01/23 0914 16     Temp 10/01/23 0914 98.2 F (  36.8 C)     Temp Source 10/01/23 0914 Oral     SpO2 10/01/23 0914 100 %     Weight 10/01/23 0912 244 lb (110.7 kg)     Height 10/01/23 0912 6' (1.829 m)     Head Circumference --      Peak Flow --      Pain Score 10/01/23 0912 7     Pain Loc --      Pain Education --      Exclude from Growth Chart --    No data found.  Updated Vital Signs BP 129/89 (BP Location: Right Arm)   Pulse 82   Temp 98.2 F (36.8 C) (Oral)   Resp 16   Ht 6' (1.829 m)   Wt 244 lb (110.7 kg)   SpO2 100%   BMI 33.09 kg/m   Visual Acuity Right Eye Distance:   Left Eye Distance:   Bilateral Distance:    Right Eye Near:   Left Eye Near:    Bilateral Near:     Physical Exam Vitals and nursing note reviewed.  Constitutional:       Appearance: Normal appearance. He is not ill-appearing.  HENT:     Head: Normocephalic and atraumatic.     Right Ear: Tympanic membrane, ear canal and external ear normal. There is no impacted cerumen.     Left Ear: Tympanic membrane, ear canal and external ear normal. There is no impacted cerumen.     Nose: Congestion and rhinorrhea present.     Comments: Nasal mucosa is erythematous and edematous with yellow-green discharge in both nares.    Mouth/Throat:     Mouth: Mucous membranes are moist.     Pharynx: Oropharynx is clear. Posterior oropharyngeal erythema present. No oropharyngeal exudate.     Comments: Mild erythema to the posterior oropharynx with green postnasal drip. Cardiovascular:     Rate and Rhythm: Normal rate and regular rhythm.     Pulses: Normal pulses.     Heart sounds: Normal heart sounds. No murmur heard.    No friction rub. No gallop.  Pulmonary:     Effort: Pulmonary effort is normal.     Breath sounds: Normal breath sounds. No wheezing, rhonchi or rales.  Musculoskeletal:     Cervical back: Normal range of motion and neck supple. No tenderness.  Lymphadenopathy:     Cervical: No cervical adenopathy.  Skin:    General: Skin is warm and dry.     Capillary Refill: Capillary refill takes less than 2 seconds.     Findings: No rash.  Neurological:     General: No focal deficit present.     Mental Status: He is alert and oriented to person, place, and time.      UC Treatments / Results  Labs (all labs ordered are listed, but only abnormal results are displayed) Labs Reviewed  SARS CORONAVIRUS 2 BY RT PCR    EKG   Radiology No results found.  Procedures Procedures (including critical care time)  Medications Ordered in UC Medications - No data to display  Initial Impression / Assessment and Plan / UC Course  I have reviewed the triage vital signs and the nursing notes.  Pertinent labs & imaging results that were available during my care of the  patient were reviewed by me and considered in my medical decision making (see chart for details).   Patient is a nontoxic-appearing 57 year old male presenting for evaluation of 5 days worth of URI symptoms  as outlined in HPI above.  On exam he does have inflamed nasal mucosa with purulent discharge in both nares as well as purulent postnasal drip and erythema to the posterior oropharynx.  Cardiopulmonary exam is benign.  Differential diagnosis include COVID, influenza, and viral/bacterial URI.  I will order a COVID PCR.  COVID PCR is negative.  I will discharge patient on the diagnosis of URI.  Given that he is Purulent discharge I will start him on Augmentin 875 twice daily for 10 days for treatment of his URI.  Atrovent nasal spray to help with nasal congestion.  Return precautions reviewed.   Final Clinical Impressions(s) / UC Diagnoses   Final diagnoses:  Upper respiratory tract infection, unspecified type     Discharge Instructions      The Augmentin twice daily with food for 10 days for treatment of your URI.  Perform sinus irrigation 2-3 times a day with a NeilMed sinus rinse kit and distilled water.  Do not use tap water.  You can use plain over-the-counter Mucinex every 6 hours to break up the stickiness of the mucus so your body can clear it.  Increase your oral fluid intake to thin out your mucus so that is also able for your body to clear more easily.  Take an over-the-counter probiotic, such as Culturelle-align-activia, 1 hour after each dose of antibiotic to prevent diarrhea.  Use the Atrovent nasal spray, 2 squirts up each nostril every 6 hours, as needed for nasal congestion and postnasal drip.  If you develop any new or worsening symptoms return for reevaluation or see your primary care provider.      ED Prescriptions     Medication Sig Dispense Auth. Provider   amoxicillin-clavulanate (AUGMENTIN) 875-125 MG tablet Take 1 tablet by mouth every 12 (twelve)  hours for 10 days. 20 tablet Becky Augusta, NP   ipratropium (ATROVENT) 0.06 % nasal spray Place 2 sprays into both nostrils 4 (four) times daily. 15 mL Becky Augusta, NP      PDMP not reviewed this encounter.   Becky Augusta, NP 10/01/23 [phone]

## 2023-11-08 ENCOUNTER — Ambulatory Visit
Admission: RE | Admit: 2023-11-08 | Discharge: 2023-11-08 | Disposition: A | Discharge status: Home / Self Care | Payer: 59 | Source: Ambulatory Visit | Attending: Family Medicine | Admitting: Family Medicine

## 2023-11-08 VITALS — BP 136/90 | HR 83 | Temp 98.4°F | Resp 18

## 2023-11-08 DIAGNOSIS — J019 Acute sinusitis, unspecified: Secondary | ICD-10-CM | POA: Diagnosis not present

## 2023-11-08 DIAGNOSIS — Z202 Contact with and (suspected) exposure to infections with a predominantly sexual mode of transmission: Secondary | ICD-10-CM | POA: Diagnosis not present

## 2023-11-08 LAB — HIV ANTIBODY (ROUTINE TESTING W REFLEX): HIV Screen 4th Generation wRfx: NONREACTIVE

## 2023-11-08 MED ORDER — PREDNISONE 10 MG (21) PO TBPK
ORAL_TABLET | Freq: Every day | ORAL | 0 refills | Status: AC
Start: 2023-11-08 — End: ?

## 2023-11-08 MED ORDER — METRONIDAZOLE 500 MG PO TABS: 2000.0000 mg | ORAL_TABLET | Freq: Once | ORAL | 0 refills | Status: AC

## 2023-11-08 MED ORDER — LEVOFLOXACIN 500 MG PO TABS: 500.0000 mg | ORAL_TABLET | Freq: Every day | ORAL | 0 refills | Status: AC

## 2023-11-08 NOTE — ED Provider Notes (Signed)
[facility]    CSN: [id number] Arrival date & time: 11/08/23  4742      History   Chief Complaint Chief Complaint  Patient presents with   Facial Pain   Exposure to STD    HPI KINNICK SELLEN is a 58 y.o. male.   HPI  History obtained from the patient. Trice presents for ongoing nasal congestion with history of recurrent sinusitis.  He has seen ENT in the past but has been several years now.  He continues to have thick nasal discharge, facial pain with headaches for the past month.  He was seen and given antibiotics which helped for about 3 days and then the symptoms returned.  No recent fever.  Nuys ear pain, sore throat, cough.  Nausea or vomiting or diarrhea.  His second concern was trichomonas exposure.  He states that his girlfriend told him that she had trichomonas.  He request STD testing.      Past Medical History:  Diagnosis Date   Hypertension    Has not taken BP Meds for about two years.    There are no active problems to display for this patient.   Past Surgical History:  Procedure Laterality Date   ABDOMINAL SURGERY     CHOLECYSTECTOMY         Home Medications    Prior to Admission medications   Medication Sig Start Date End Date Taking? Authorizing Provider  levofloxacin (LEVAQUIN) 500 MG tablet Take 1 tablet (500 mg total) by mouth daily. 11/08/23  Yes Davonda Ausley, DO  metroNIDAZOLE (FLAGYL) 500 MG tablet Take 4 tablets (2,000 mg total) by mouth once for 1 dose. 11/08/23 11/08/23 Yes Avalynn Bowe, DO  predniSONE (STERAPRED UNI-PAK 21 TAB) 10 MG (21) TBPK tablet Take by mouth daily. Take 6 tabs by mouth daily for 1, then 5 tabs for 1 day, then 4 tabs for 1 day, then 3 tabs for 1 day, then 2 tabs for 1 day, then 1 tab for 1 day. 11/08/23  Yes Anikin Prosser, Seward Meth, DO  acetaminophen (TYLENOL) 500 MG tablet Take 1 tablet (500 mg total) by mouth every 6 (six) hours as needed. 11/21/18   Enid Derry, PA-C  amLODipine (NORVASC) 10 MG  tablet Take 1 tablet (10 mg total) by mouth daily. 01/06/18   Tommie Sams, DO  atorvastatin (LIPITOR) 20 MG tablet Take 1 tablet by mouth daily. 08/05/23   [provider]  ipratropium (ATROVENT) 0.06 % nasal spray Place 2 sprays into both nostrils 4 (four) times daily. 10/01/23   Becky Augusta, NP  losartan-hydrochlorothiazide (HYZAAR) 100-25 MG tablet Take 1 tablet by mouth every morning. 09/13/23   [provider]  naproxen (NAPROSYN) 500 MG tablet Take 500 mg by mouth 2 (two) times daily.    [provider]  spironolactone (ALDACTONE) 25 MG tablet Take by mouth. 09/13/23   [provider]    Family History Family History  Problem Relation Age of Onset   Diabetes Mother    Cancer Father     Social History Social History   Tobacco Use   Smoking status: Every Day    Current packs/day: 0.50    Types: Cigarettes   Smokeless tobacco: Never  Vaping Use   Vaping status: Never Used  Substance Use Topics   Alcohol use: No   Drug use: Yes    Types: Marijuana    Comment: last use 3 days ago     Allergies   Lisinopril   Review of  Systems Review of Systems: negative unless otherwise stated in HPI.      Physical Exam Triage Vital Signs ED Triage Vitals  Encounter Vitals Group     BP 11/08/23 0841 (!) 136/90     Systolic BP Percentile --      Diastolic BP Percentile --      Pulse Rate 11/08/23 0841 83     Resp 11/08/23 0841 18     Temp 11/08/23 0841 98.4 F (36.9 C)     Temp src --      SpO2 11/08/23 0841 96 %     Weight --      Height --      Head Circumference --      Peak Flow --      Pain Score 11/08/23 0840 3     Pain Loc --      Pain Education --      Exclude from Growth Chart --    No data found.  Updated Vital Signs BP (!) 136/90   Pulse 83   Temp 98.4 F (36.9 C)   Resp 18   SpO2 96%   Visual Acuity Right Eye Distance:   Left Eye Distance:   Bilateral Distance:    Right Eye Near:   Left Eye Near:     Bilateral Near:     Physical Exam GEN:     alert, well appearing male in no distress    HENT:  mucus membranes moist,  moderate erythematous edematous turbinates, thick yellow nasal discharge, maxillary and frontal sinus tenderness EYES:   no scleral injection or discharge NECK:  normal ROM, no lymphadenopathy RESP:  clear to auscultation bilaterally CVS:   regular rate and rhythm Skin:   warm and dry    UC Treatments / Results  Labs (all labs ordered are listed, but only abnormal results are displayed) Labs Reviewed  HIV ANTIBODY (ROUTINE TESTING W REFLEX)  RPR  CYTOLOGY, (ORAL, ANAL, URETHRAL) ANCILLARY ONLY    EKG   Radiology No results found.  Procedures Procedures (including critical care time)  Medications Ordered in UC Medications - No data to display  Initial Impression / Assessment and Plan / UC Course  I have reviewed the triage vital signs and the nursing notes.  Pertinent labs & imaging results that were available during my care of the patient were reviewed by me and considered in my medical decision making (see chart for details).      Pt is a 58 y.o. male who presents for 4 weeks of nasal congestion that is not improving.  Colman is afebrile here without recent antipyretics. Satting adequately on room air. Overall pt is  non-toxic appearing, well hydrated, without respiratory distress.  COVID  and influenza testing deferred due to length of symptoms.  He has edematous erythematous turbinates with thick yellow discharge in the naris with maxillary and frontal sinus tenderness concerning for acute on chronic sinusitis.  Treat with steroids and Levaquin as he completed a course of Augmentin on 09/30/2022 without relief.  Risk and benefits of this medication discussed.  He will take pre-/probiotics while taking this medication to help thwart diarrhea.  Typical duration of symptoms discussed.   Petter was exposed to trichomonas.  Denies symptoms.  Request  treatment for trichomonas.  Obtained gonorrhea, chlamydia and trichomonas swab.  Recommended HIV and syphilis and he is agreeable.  Advised him to use condoms with every sexual encounter.  Treat trichomonas with metronidazole 2000 g for 1 dose.  Return and ED precautions given and patient voiced understanding. Discussed MDM, treatment plan and plan for follow-up with patient who agrees with plan.     Final Clinical Impressions(s) / UC Diagnoses   Final diagnoses:  Trichomonas exposure  Exposure to STD  Acute sinusitis, recurrence not specified, unspecified location     Discharge Instructions       Your STD test results will be available in the next 72 hours. If positive, someone will contact you.  You should see your results in your MyChart account.   Follow-up with your primary care doctor to seek out referral to and ear, nose, throat (ENT) provider if your nasal congestion does not improve.    ED Prescriptions     Medication Sig Dispense Auth. Provider   metroNIDAZOLE (FLAGYL) 500 MG tablet Take 4 tablets (2,000 mg total) by mouth once for 1 dose. 4 tablet Trinette Vera, DO   levofloxacin (LEVAQUIN) 500 MG tablet Take 1 tablet (500 mg total) by mouth daily. 7 tablet Efosa Treichler, DO   predniSONE (STERAPRED UNI-PAK 21 TAB) 10 MG (21) TBPK tablet Take by mouth daily. Take 6 tabs by mouth daily for 1, then 5 tabs for 1 day, then 4 tabs for 1 day, then 3 tabs for 1 day, then 2 tabs for 1 day, then 1 tab for 1 day. 21 tablet Katha Cabal, DO      PDMP not reviewed this encounter.   Katha Cabal, DO 11/08/23 [phone]

## 2023-11-08 NOTE — Discharge Instructions (Signed)
Your STD test results will be available in the next 72 hours. If positive, someone will contact you.  You should see your results in your MyChart account.   Follow-up with your primary care doctor to seek out referral to and ear, nose, throat (ENT) provider if your nasal congestion does not improve.

## 2023-11-08 NOTE — ED Triage Notes (Signed)
Pt presents with complaints of continued facial pain and nasal burning from previous visit. Concern for continued sinus infection. Reports his symptoms got better and then came back a couple weeks ago.   Pts partner also tested positive for Trichomonas. Pt reports feeling some issues with urination flow.

## 2023-11-09 LAB — CYTOLOGY, (ORAL, ANAL, URETHRAL) ANCILLARY ONLY
Chlamydia: NEGATIVE
Comment: NEGATIVE
Comment: NEGATIVE
Comment: NORMAL
Neisseria Gonorrhea: NEGATIVE
Trichomonas: NEGATIVE

## 2023-11-09 LAB — RPR: RPR Ser Ql: NONREACTIVE
